# Patient Record
Sex: Female | Born: 1970
Health system: Southern US, Community
[De-identification: ages and names within clinical notes are randomized; demographics above are authoritative.]

## PROBLEM LIST (undated history)

## (undated) DIAGNOSIS — Z5189 Encounter for other specified aftercare: Secondary | ICD-10-CM

## (undated) DIAGNOSIS — E079 Disorder of thyroid, unspecified: Secondary | ICD-10-CM

## (undated) DIAGNOSIS — I10 Essential (primary) hypertension: Secondary | ICD-10-CM

## (undated) DIAGNOSIS — K219 Gastro-esophageal reflux disease without esophagitis: Secondary | ICD-10-CM

## (undated) DIAGNOSIS — E785 Hyperlipidemia, unspecified: Secondary | ICD-10-CM

## (undated) DIAGNOSIS — F419 Anxiety disorder, unspecified: Secondary | ICD-10-CM

## (undated) DIAGNOSIS — K449 Diaphragmatic hernia without obstruction or gangrene: Secondary | ICD-10-CM

## (undated) HISTORY — DX: Anxiety disorder, unspecified: F41.9

## (undated) HISTORY — PX: OTHER SURGICAL HISTORY: SHX169

## (undated) HISTORY — DX: Disorder of thyroid, unspecified: E07.9

## (undated) HISTORY — DX: Gastro-esophageal reflux disease without esophagitis: K21.9

## (undated) HISTORY — DX: Encounter for other specified aftercare: Z51.89

## (undated) HISTORY — DX: Hyperlipidemia, unspecified: E78.5

## (undated) HISTORY — DX: Essential (primary) hypertension: I10

## (undated) HISTORY — DX: Diaphragmatic hernia without obstruction or gangrene: K44.9

---
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
## 1997-09-15 ENCOUNTER — Other Ambulatory Visit: Admission: RE | Admit: 1997-09-15 | Discharge: 1997-09-15 | Payer: Self-pay | Admitting: Obstetrics and Gynecology

## 1998-11-23 ENCOUNTER — Inpatient Hospital Stay (HOSPITAL_COMMUNITY): Admission: AD | Admit: 1998-11-23 | Discharge: 1998-11-26 | Payer: Self-pay | Admitting: Obstetrics and Gynecology

## 1999-01-04 ENCOUNTER — Other Ambulatory Visit: Admission: RE | Admit: 1999-01-04 | Discharge: 1999-01-04 | Payer: Self-pay | Admitting: Obstetrics and Gynecology

## 2000-01-24 ENCOUNTER — Other Ambulatory Visit: Admission: RE | Admit: 2000-01-24 | Discharge: 2000-01-24 | Payer: Self-pay | Admitting: Obstetrics and Gynecology

## 2000-05-16 ENCOUNTER — Ambulatory Visit (HOSPITAL_COMMUNITY): Admission: RE | Admit: 2000-05-16 | Discharge: 2000-05-16 | Payer: Self-pay | Admitting: *Deleted

## 2000-10-30 ENCOUNTER — Other Ambulatory Visit: Admission: RE | Admit: 2000-10-30 | Discharge: 2000-10-30 | Payer: Self-pay | Admitting: Obstetrics and Gynecology

## 2001-05-06 ENCOUNTER — Inpatient Hospital Stay (HOSPITAL_COMMUNITY): Admission: RE | Admit: 2001-05-06 | Discharge: 2001-05-09 | Payer: Self-pay | Admitting: Obstetrics and Gynecology

## 2001-05-06 ENCOUNTER — Encounter (INDEPENDENT_AMBULATORY_CARE_PROVIDER_SITE_OTHER): Payer: Self-pay

## 2001-06-24 ENCOUNTER — Other Ambulatory Visit: Admission: RE | Admit: 2001-06-24 | Discharge: 2001-06-24 | Payer: Self-pay | Admitting: Obstetrics and Gynecology

## 2002-07-10 ENCOUNTER — Other Ambulatory Visit: Admission: RE | Admit: 2002-07-10 | Discharge: 2002-07-10 | Payer: Self-pay | Admitting: Obstetrics and Gynecology

## 2003-07-29 ENCOUNTER — Other Ambulatory Visit: Admission: RE | Admit: 2003-07-29 | Discharge: 2003-07-29 | Payer: Self-pay | Admitting: Obstetrics and Gynecology

## 2004-10-03 ENCOUNTER — Other Ambulatory Visit: Admission: RE | Admit: 2004-10-03 | Discharge: 2004-10-03 | Payer: Self-pay | Admitting: Obstetrics and Gynecology

## 2004-10-27 ENCOUNTER — Encounter (INDEPENDENT_AMBULATORY_CARE_PROVIDER_SITE_OTHER): Payer: Self-pay | Admitting: Specialist

## 2004-10-27 ENCOUNTER — Ambulatory Visit (HOSPITAL_COMMUNITY): Admission: RE | Admit: 2004-10-27 | Discharge: 2004-10-27 | Payer: Self-pay | Admitting: Obstetrics and Gynecology

## 2005-03-13 HISTORY — PX: VAGINAL HYSTERECTOMY: SUR661

## 2005-03-13 HISTORY — PX: ABDOMINAL HYSTERECTOMY: SHX81

## 2005-12-14 ENCOUNTER — Inpatient Hospital Stay (HOSPITAL_COMMUNITY): Admission: AD | Admit: 2005-12-14 | Discharge: 2005-12-14 | Payer: Self-pay | Admitting: Obstetrics and Gynecology

## 2006-02-07 ENCOUNTER — Ambulatory Visit (HOSPITAL_COMMUNITY): Admission: RE | Admit: 2006-02-07 | Discharge: 2006-02-08 | Payer: Self-pay | Admitting: Obstetrics and Gynecology

## 2006-02-07 ENCOUNTER — Encounter (INDEPENDENT_AMBULATORY_CARE_PROVIDER_SITE_OTHER): Payer: Self-pay | Admitting: Specialist

## 2007-10-21 LAB — CONVERTED CEMR LAB: Pap Smear: NORMAL

## 2008-03-13 HISTORY — PX: UPPER GASTROINTESTINAL ENDOSCOPY: SHX188

## 2008-05-01 ENCOUNTER — Ambulatory Visit: Payer: Self-pay | Admitting: Gastroenterology

## 2008-05-01 DIAGNOSIS — R197 Diarrhea, unspecified: Secondary | ICD-10-CM | POA: Insufficient documentation

## 2008-05-01 DIAGNOSIS — K625 Hemorrhage of anus and rectum: Secondary | ICD-10-CM | POA: Insufficient documentation

## 2008-05-01 DIAGNOSIS — R112 Nausea with vomiting, unspecified: Secondary | ICD-10-CM

## 2008-05-01 DIAGNOSIS — K5289 Other specified noninfective gastroenteritis and colitis: Secondary | ICD-10-CM

## 2008-05-01 DIAGNOSIS — R1013 Epigastric pain: Secondary | ICD-10-CM

## 2008-05-01 DIAGNOSIS — K219 Gastro-esophageal reflux disease without esophagitis: Secondary | ICD-10-CM

## 2008-05-01 DIAGNOSIS — E039 Hypothyroidism, unspecified: Secondary | ICD-10-CM | POA: Insufficient documentation

## 2008-05-06 LAB — CONVERTED CEMR LAB
Amylase: 49 units/L (ref 27–131)
Basophils Absolute: 0 10*3/uL (ref 0.0–0.1)
Bilirubin, Direct: 0.1 mg/dL (ref 0.0–0.3)
Eosinophils Absolute: 0.2 10*3/uL (ref 0.0–0.7)
HCT: 47.4 % — ABNORMAL HIGH (ref 36.0–46.0)
Lipase: 20 units/L (ref 11.0–59.0)
MCHC: 34.6 g/dL (ref 30.0–36.0)
MCV: 92.9 fL (ref 78.0–100.0)
Monocytes Absolute: 0.8 10*3/uL (ref 0.1–1.0)
Neutro Abs: 3.2 10*3/uL (ref 1.4–7.7)
Platelets: 216 10*3/uL (ref 150–400)
RDW: 12 % (ref 11.5–14.6)
Total Bilirubin: 0.6 mg/dL (ref 0.3–1.2)

## 2008-05-13 ENCOUNTER — Ambulatory Visit: Payer: Self-pay | Admitting: Internal Medicine

## 2008-05-14 ENCOUNTER — Ambulatory Visit: Payer: Self-pay | Admitting: Internal Medicine

## 2008-05-14 DIAGNOSIS — F411 Generalized anxiety disorder: Secondary | ICD-10-CM | POA: Insufficient documentation

## 2008-06-11 DIAGNOSIS — K449 Diaphragmatic hernia without obstruction or gangrene: Secondary | ICD-10-CM | POA: Insufficient documentation

## 2008-06-16 ENCOUNTER — Ambulatory Visit: Payer: Self-pay | Admitting: Internal Medicine

## 2009-01-11 ENCOUNTER — Encounter: Payer: Self-pay | Admitting: Nurse Practitioner

## 2009-07-01 ENCOUNTER — Encounter: Admission: RE | Admit: 2009-07-01 | Discharge: 2009-07-01 | Payer: Self-pay | Admitting: Obstetrics and Gynecology

## 2009-07-21 ENCOUNTER — Encounter: Payer: Self-pay | Admitting: Internal Medicine

## 2009-11-03 ENCOUNTER — Encounter: Payer: Self-pay | Admitting: Internal Medicine

## 2010-04-12 NOTE — Letter (Signed)
Summary: [facility]   Imported By: Sherian Rein 08/11/2009 14:14:00  _____________________________________________________________________  External Attachment:    Type:   Image     Comment:   External Document

## 2010-04-12 NOTE — Medication Information (Signed)
Summary: Nexium/[facility]  Nexium/[facility]   Imported By: Sherian Rein 11/08/2009 08:09:27  _____________________________________________________________________  External Attachment:    Type:   Image     Comment:   External Document

## 2010-07-29 NOTE — H&P (Signed)
NAME:  Erin Kaufman, Erin Kaufman             ACCOUNT NO.:  [id number]   MEDICAL RECORD NO.:  [id number]          PATIENT TYPE:  AMB   LOCATION:  SDC                           FACILITY:  [facility]   PHYSICIAN:  Juluis Mire, M.D.   DATE OF BIRTH:  August 31, 1970   DATE OF ADMISSION:  10/27/2004  DATE OF DISCHARGE:                                HISTORY & PHYSICAL   HISTORY OF PRESENT ILLNESS:  The patient is a 40 year old gravida 3, para 3,  married female who presents for NovaSure ablation.   In relation to the present admission, cycles are regular at the present  time.  She reports increasing menstrual flow.  At times she will have the  first two days of flooding through her clothes.  She had a previous saline  infusion ultrasound in 2004 that was negative.  There were findings highly  suggestive of adenomyosis.  We had discussed with her various options  including birth control pills versus Marina IUD versus endometrial ablation.  The patient now presents for endometrial ablation.  We will do a preablative  hysteroscopy to evaluate the intra-uterine cavity.   ALLERGIES:  She is allergic to PENICILLIN.   MEDICATIONS:  Synthroid.   PAST MEDICAL HISTORY:  Significant in that she had a thyroid nodule.  She is  under active evaluation by Dr. Gerrit Friends.  She had previous aspiration and is  presently on Synthroid as noted.  Otherwise, usual childhood diseases.   PAST SURGICAL HISTORY:  She has had her wisdom teeth extracted.  She has had  an abdominoplasty.  She has had three cesarean sections.   FAMILY HISTORY:  Noncontributory.   SOCIAL HISTORY:  No tobacco or alcohol use.   REVIEW OF SYSTEMS:  Noncontributory.   PHYSICAL EXAMINATION:  VITAL SIGNS:  The patient is afebrile with stable  vital signs.  HEENT:  The pupils are equal, round and reactive to light and accommodation.  Extraocular movements were intact.  Sclerae and conjunctivae are clear.  Oropharynx clear.  No palpable thyromegaly.  BREASTS:  No discrete masses.  LUNGS:  Clear.  CARDIOVASCULAR:  Regular rhythm and rate without murmurs or gallops.  ABDOMEN:  Previous abdominoplasty noted.  Otherwise no masses, organomegaly  or tenderness.  PELVIC:  Normal external genitalia.  Vaginal mucosa is clear.  Cervix is  unremarkable.  Uterus is upper limits of normal size.  Adnexa unremarkable.  EXTREMITIES:  Trace edema.  NEUROLOGIC:  Exam is grossly within normal limits.   IMPRESSION:  Menorrhagia probably secondary to adenomyosis.   PLAN:  The patient will undergo hysteroscopy along with NovaSure ablation.  Success rates of 80% are quoted.  Risks of surgery explained including the  risks of infection, risks of hemorrhage that could require transfusion with  the risks of AIDS or hepatitis and the possible need for hysterectomy, risks  of injury to adjacent organs or perforation that could lead to exploratory  surgery, risks of deep vein thrombosis and pulmonary embolus.  The patient  expressed understanding of indications and risks and other alternatives.      Juluis Mire, M.D.  Electronically Signed  JSM/MEDQ  D:  10/27/2004  T:  10/27/2004  Job:  [id number]

## 2010-07-29 NOTE — H&P (Signed)
[facility]  Patient:    Erin Kaufman, Erin Kaufman Visit Number: [id number] MRN: [id number]          Service Type: Attending:  Juluis Mire, M.D. Dictated by:   Juluis Mire, M.D. Adm. Date:  05/06/01                           History and Physical  HISTORY OF PRESENT ILLNESS:   The patient is a 40 year old gravida 3 para 2 married 2 married white female, last menstrual period August 15, 2000, giving estimated date of confinement of May 22, 2001.  This gives her an estimated gestational age of 38+ weeks.  However, by two prior ultrasounds - one done at 18 and at 25 weeks, she has an estimated date of confinement of May 11, 2001. This gives her an estimated gestational age of [redacted] weeks.                                The patients two prior pregnancies were delivered by cesarean section.  After discussion of trial of labor this patient decided to proceed with repeat cesarean section.  She is also desirous of permanent sterilization at this time.  Alternatives of birth control have been discussed.  She does understand there is a failure rate with sterilization of 1:200.  Failures can in the form of ectopic pregnancy requiring further surgical management.  Her prenatal course has been complicated by positive group B strep screen with prior pregnancy.  Otherwise, her prenatal course has been uncomplicated.  ALLERGIES:                    No known drug allergies.  MEDICATIONS:                  Prenatal vitamins.  PRENATAL LABORATORY DATA:     The patient is O-positive.  Negative antibody screen.  Nonreactive serology.  Positive rubella titer.  Negative hepatitis B surface antigen.  Maternal serum triple screen was negative.  A 50 g glucola was 93.  PAST MEDICAL HISTORY/FAMILY HISTORY/SOCIAL:               Please see prenatal records.  REVIEW OF SYSTEMS:            Noncontributory.  PHYSICAL EXAMINATION:  VITAL SIGNS:                  The patient is  afebrile with stable vital signs.  HEENT:                        Normocephalic.  PERRLA.  EOMI.  Sclerae and conjunctivae clear.  Oropharynx clear.  NECK:                         Without thyromegaly.  BREAST:                       No discrete masses but glandular.  LUNGS:                        Clear.  CARDIAC:                      Regular rate and rhythm with grade 2/6 systolic ejection murmur.  No clicks  or gallops.  ABDOMEN:                      Gravid uterus consistent with dates.  No masses, organomegaly, or tenderness.  PELVIC:                       Cervix long and closed.  EXTREMITIES:                  Trace edema.  NEUROLOGIC:                   Grossly within normal limits.  IMPRESSION:                   1. Intrauterine pregnancy at 39 weeks.                               2. Prior cesarean section, desires repeat.                               3. Multiparity, desires sterility.                               4. Positive group B streptococci screen w                                  prior pregnancy.  PLAN:                         We will proceed with repeat cesarean section and bilateral tubal ligation.  The risks of surgery have been discussed including the risk of infection, the risk of hemorrhage that could require transfusion with the risk of AIDS or hepatitis, the risk of injury to adjacent organs including bladder, bowel, or ureters that could require further exploratory surgery, the risk of deep vein thrombosis and pulmonary embolus.  The patient expressed understanding of the indications and risks, and is accepting of them. Dictated by:   Juluis Mire, M.D. Attending:  Juluis Mire, M.D. DD:  05/06/01 TD:  05/06/01 Job: [id number]

## 2010-07-29 NOTE — H&P (Signed)
NAME:  Erin Kaufman, Erin Kaufman             ACCOUNT NO.:  [id number]   MEDICAL RECORD NO.:  [id number]          PATIENT TYPE:  AMB   LOCATION:  SDC                           FACILITY:  [facility]   PHYSICIAN:  Juluis Mire, M.D.   DATE OF BIRTH:  12/07/1970   DATE OF ADMISSION:  02/07/2006  DATE OF DISCHARGE:                              HISTORY & PHYSICAL   HISTORY OF PRESENT ILLNESS:  The patient is a 40 year old gravida 3,  para 3 married female who presents for laparoscopically assisted vaginal  hysterectomy.   The patient had a previous NovaSure ablation. She has been amenorrheic.  However, she has recurrent episodes of severe left lower quadrant pain  that occur on a monthly basis.  We have done ultrasound evaluation  including saline infusion.  She really has no endometrium per se, but  seems to have pockets of blood which we can't tell whether it is  hematometrium or whether this is just significant adenomyosis leading to  the intermittent pain.  Other work up has been completely negative.  There is no evidence of a GI or urological cause.  In view of these  persistent episodes of pain, thought to be secondary to adenomyosis  versus hematometrium, the patient now presents for laparoscopically  assisted vaginal hysterectomy.   ALLERGIES:  In terms of allergies, she is ALLERGIC to PENICILLIN.   MEDICATIONS:  Synthroid.   PAST MEDICAL HISTORY:  She did have a thyroid nodule that is being  evaluated by Dr. Gerrit Friends.  She had a previous aspiration and is presently  on Synthroid replacement.   PAST SURGICAL HISTORY:  She had her wisdom teeth extracted.  She has had  an abdominoplasty and three Cesarean sections.   FAMILY HISTORY:  Noncontributory.   SOCIAL HISTORY:  No tobacco or alcohol use.   REVIEW OF SYSTEMS:  Noncontributory.   PHYSICAL EXAMINATION:  VITAL SIGNS:  Patient is afebrile with stable  vital signs.  HEENT:  Patient is normocephalic. Pupils equal, round, reactive to  light  and accommodation.  Extraocular movements intact.  Sclerae and  conjunctivae are clear.  Oropharynx is clear.  NECK:  Without thyromegaly.  BREASTS:  No discrete masses.  LUNGS:  Clear.  CARDIAC SYSTEM:  Regular rate and rhythm without murmurs, rubs or  gallops.  ABDOMEN:  Her abdominal exam is benign, no masses, organomegaly or  tenderness.  PELVIS:  Normal external genitalia, vaginal mucosa is clear.  Cervix is  unremarkable.  Uterus normal size, shape and contour.  Adnexa are free  of masses or tenderness.  EXTREMITIES:  Trace edema.  NEUROLOGICAL:  Exam is grossly within normal limits.   IMPRESSION:  Intermittent pelvic pain thought to be secondary to  adenomyosis and/or hematometrium.   PLAN:  The patient is to undergo laparoscopically assisted vaginal  hysterectomy.  The risks of surgery have been discussed including the  risk of infection, the risk of hemorrhage that could require transfusion  with risk of AIDS or hepatitis, risk of injury to adjacent organs  including bowel, bladder, ureters that could require further exploratory  surgery, risk of deep venous  thrombosis and pulmonary embolus.  The  patient expressed understanding of indications and risks.      Juluis Mire, M.D.  Electronically Signed     JSM/MEDQ  D:  02/07/2006  T:  02/07/2006  Job:  [id number]

## 2010-07-29 NOTE — Op Note (Signed)
NAME:  Erin Kaufman, Erin Kaufman             ACCOUNT NO.:  [id number]   MEDICAL RECORD NO.:  [id number]          PATIENT TYPE:  AMB   LOCATION:  SDC                           [facility]ACILITY:  [facility]   PHYSICIAN:  Juluis Mire, M.D.   DATE OF BIRTH:  [birth year]   DATE OF PROCEDURE:  02/07/2006  DATE OF DISCHARGE:                               OPERATIVE REPORT   PREOPERATIVE DIAGNOSIS:  Adenomyosis.   POSTOPERATIVE DIAGNOSIS:  Adenomyosis with pelvic adhesions.   PROCEDURE:  Laparoscopically assisted vaginal hysterectomy along with  cystoscopy.   SURGEON:  Juluis Mire, M.D.   ASSISTANT:  Zelphia Cairo, MD   ANESTHESIA:  General endotracheal.   ESTIMATED BLOOD LOSS:  400 mL.   PACKS AND DRAINS:  None.   INTRAOPERATIVE BLOOD REPLACED:  None.   COMPLICATIONS:  None.   INDICATIONS:  Are as noted in the history and physical.   PROCEDURE:  The patient taken to OR, placed supine position.  After  satisfactory level of general endotracheal anesthesia was obtained, the  patient was placed in the dorsal lithotomy position using the Allen  stirrups.  The abdomen, perineum, vagina prepped out with Betadine.  The  bladder was emptied by in-and-out catheterization.  A Hulka tenaculum  put in place and secured.  Patient then draped in sterile field.  The  patient has had a previous abdominoplasty.  We did make a subumbilical  incision with a knife.  Veress needle was inserted.  The abdomen was  inflated with approximately 3.5 liter carbon dioxide.  Operating  laparoscope was introduced.  Laparoscope was through introduced.  There  was no injury to adjacent organs.  It was noted because of her  abdominoplasty, the umbilicus was very close to the uterus.  The uterus  was densely adherent to the anterior abdominal wall probably from her  prior cesarean section.  She has had evidence of previous bilateral  tubal ligation.  The left ovary was adherent the uterus.  The right  ovary was unremarkable.   Appendix was visualized and noted to be normal.  The upper abdomen including the liver tip and gallbladder were clear.  There is no evidence of other pelvic pathology.  Due to the adherence of  the anterior abdominal wall, we put in our 5-mm trocar in the left and  right lower quadrant trying to avoid the bladder.  At this point, we  went to the right side using the gyrus, we cauterized and incised the  right utero-ovarian pedicle.  We then cauterized and incised the right  round ligament freeing up the right side.  We then went to the left side  in order to get to the ovary, we had to free some of the adhesions to  the anterior part of the uterus to anterior abdominal wall.  These were  taken down using cautery and incision with the gyrus.  After we had  freed this we were able to identify the ovary a little better.  We  identified the utero-ovarian pedicle.  We cauterized incised that  freeing the ovary from its attachment to the uterus.  We then identified  the round ligament and cauterized and incised that.  Using sharp  dissection, we further developed the anterior part of the uterus and  freed up from the adhesions to anterior abdominal wall and hopefully  from the bladder connections.  We did not see any evidence of any  bladder injury at this point time and the uterus was adequately freed.  There was no active bleeding.  Decision was to go vaginally.  The  laparoscope was removed.  Abdomen was deflated of carbon dioxide, legs  repositioned, Hulka tenaculum was then removed.  Weighted speculum  placed in vaginal vault.  Cervix was grasped with Christella Hartigan tenaculum, cul-  de-sac was entered sharply.  Both uterosacral ligaments were clamped,  cut, suture ligated with 0 Vicryl.  Reflection of vaginal mucosa  anteriorly was incised.  The bladder dissected superiorly.  Using the  gyrus, the paracervical tissue was cauterized, incised.  Next  parametrial tissue was cauterized incised.  We  tried to identify the  vesicouterine space.  It was difficult due to scarring.  We continued  separating the parametrium from the sides using cautery incision.  We  then flipped the uterus, identified the anterior peritoneum and entered  it sharply, remaining pedicles were clamped and cut, uterus and cervix  passed off the operative field and sent for pathology.  Remaining  pedicles secured with free ties of 0 Vicryl.  Some bleeding was noted,  well controlled with figure-of-eights of 0 Vicryl.  The vaginal mucosa  was then reapproximated in a vertical fashion with figure-of-eights of 0  Vicryl.   Decision was to do cystoscopy due the adhesions to the bladder just to  make sure no entry was noted.  She was given indigo carmine.  The  cystoscope was introduced.  The bladder was distended with irrigation.  Visualization revealed no evidence of any injury to the bladder.  Both  ureteral orifices were noted be spilling blue tinged urine.  The  cystoscope was then removed.  Foley was placed back to straight drain.  The patient's legs were repositioned.   Laparoscope was reintroduced, and was reinflated with carbon dioxide.  We thoroughly irrigated the pelvis, some areas of oozing were noted  vaginal cuff, brought under control with the gyrus.  Both ovaries were  hemostatically intact.  This point in time we redistended the bladder  with irrigation and noted its position in the pelvis, making sure again  no leakage was noted and again there was no signs of any injury to the  bladder.  We again irrigated the pelvis.  We had good hemostasis.  Abdomen was deflated of its carbon dioxide.  All trocars removed.  We  did put a figure-of-eights into the subumbilical fascia of 0 Vicryl.  Skin was closed interrupted subcuticulars of 4-0 Vicryl.  The lower  incisions closed with Dermabond.  A Foley was draining clear blue tinged urine.  Patient taken out of dorsal lithotomy position.  Once alert,   extubated transferred to recovery good condition.  Sponge, instrument,  needle count was reported correct by circulating nurse.      Juluis Mire, M.D.  Electronically Signed     JSM/MEDQ  D:  02/07/2006  T:  02/07/2006  Job:  [id number]

## 2010-07-29 NOTE — Discharge Summary (Signed)
NAME:  Barritt, Noel             ACCOUNT NO.:  [id number]   MEDICAL RECORD NO.:  [id number]          PATIENT TYPE:  OIB   LOCATION:  9307                          FACILITY:  [facility]   PHYSICIAN:  Juluis Mire, M.D.   DATE OF BIRTH:  [birth year]   DATE OF ADMISSION:  02/07/2006  DATE OF DISCHARGE:  02/08/2006                               DISCHARGE SUMMARY   ADMITTING DIAGNOSIS:  Uterine adenomyosis.   POSTOPERATIVE DIAGNOSES:  1. Uterine adenomyosis.  2. Pelvic adhesions.   OPERATIVE PROCEDURES:  Laparoscopic-assisted vaginal hysterectomy,  cystoscopy.   For complete History and Physical, please see dictated note.   COURSE IN THE HOSPITAL:  The patient underwent above noted surgery.  Because of extensive pelvic adhesions, cystoscopy was performed to rule  out bladder injuries. There was no bladder injury noted. Postop did  excellent. Discharged home first postop day. At that time was afebrile,  stable vital signs. Abdomen was soft; bowel sounds were active. She was  voiding without difficulty. No active vaginal bleeding. All incisions  were intact. Hemoglobin 11.1.   In terms of complications, none were encountered during the stay in the  hospital. The patient discharged in stable condition.   DISPOSITION:  The patient is to avoid heavy lifting, vaginal entry,  driving a car. Discharged home on Tylox if needs for pain. She is to  watch for signs of infection, nausea, vomiting, active vaginal bleeding,  increasing abdominal or pelvic pain. Also watch for deep seated venous  thrombosis or signs of pulmonary embolus. Follow up in the office in one  week.      Juluis Mire, M.D.  Electronically Signed     JSM/MEDQ  D:  02/08/2006  T:  02/08/2006  Job:  [id number]

## 2010-07-29 NOTE — Op Note (Signed)
NAME:  Erin Kaufman, Erin Kaufman             ACCOUNT NO.:  [id number]   MEDICAL RECORD NO.:  [id number]          PATIENT TYPE:  AMB   LOCATION:  SDC                           FACILITY:  [facility]   PHYSICIAN:  Juluis Mire, M.D.   DATE OF BIRTH:  Apr 11, 1970   DATE OF PROCEDURE:  10/27/2004  DATE OF DISCHARGE:                                 OPERATIVE REPORT   PREOPERATIVE DIAGNOSIS:  Menorrhagia.   POSTOPERATIVE DIAGNOSIS:  Menorrhagia.   PROCEDURE:  Hysteroscopy, endometrial curettings.  NovaSure ablation.   SURGEON:  Juluis Mire, M.D.   ANESTHESIA:  Sedation with paracervical block.   ESTIMATED BLOOD LOSS:  Minimal.   PACKS AND DRAINS:  None.   BLOOD REPLACED:  None.   COMPLICATIONS:  None.   INDICATIONS FOR PROCEDURE:  Dictated in history and physical.   DESCRIPTION OF PROCEDURE:  The patient was taken to the OR and placed in the  supine position.  After satisfactory level of sedation, the patient was  placed in the dorsal lithotomy position using the Allen stirrups.  The  patient was then draped as a sterile field.  A speculum was placed in the  vaginal vault.  The cervix and vagina were cleansed with Betadine.  A  paracervical block was instituted using 1% Nesacaine.  Cervix was secured  with a single tooth tenaculum.  Uterus sounded to 10 cm.  Endocervical  length was 5 cm.  The cervix was serially dilated to a size 27 Pratt  dilator.  The nonoperative hysteroscope was introduced into the intrauterine  cavity and was distended using Ringer's lactate.  Endometrial evaluation was  unremarkable.  We then obtained endometrial curettings.  NovaSure was then  introduced.  It was properly expanded.  Total cavity width was 4 cm.  We did  pass the CO2 test.  The ablation was then undertaken at a power of 110 for 1  minute 26 seconds.  The NovaSure was then removed intact.  Reevaluation with  the hysteroscope revealed adequately ablated endometrium.  There were no  signs of  perforation  or other complications.  The hysteroscope, single tooth tenaculum, and  speculum were then removed.  The patient was taken out of the dorsal  lithotomy.  Once alert, transferred to the recovery room in good condition.  Needle, sponge, and instrument counts correct.      Juluis Mire, M.D.  Electronically Signed     JSM/MEDQ  D:  10/27/2004  T:  10/27/2004  Job:  [id number]

## 2010-07-29 NOTE — Discharge Summary (Signed)
[facility]  Patient:    Erin Kaufman, Erin Kaufman Visit Number: [id number] MRN: [id number]          Service Type: OBS Location: 910A [id number] [id number] Attending Physician:  Frederich Balding Dictated by:   Danie Chandler, R.N. Admit Date:  05/06/2001 Discharge Date: 05/09/2001                             Discharge Summary  ADMITTING DIAGNOSES:          1. Intrauterine pregnancy at term with prior                                  cesarean section, desires repeat.                               2. Multiparity, desires sterility.  DISCHARGE DIAGNOSES:          1. Intrauterine pregnancy at term with prior                                  cesarean section, desires repeat.                               2. Multiparity, desires sterility.  PROCEDURE:                    On May 06, 2001 repeat low transverse cesarean section and bilateral tubal ligation.  REASON FOR ADMISSION:         Please see dictated H&P.  HOSPITAL COURSE:              The patient was taken to the operating room and underwent the above named procedure without complications.  This was productive of a viable female infant with Apgars of 8 at one minute and 9 at five minutes and an arterial cord pH of 7.29.  Postoperatively on day #1 the patient was without complaints.  Her hemoglobin was 10.0, hematocrit 29.0, and white blood cell count 9.1.  On postoperative day #2 vital signs were stable and afebrile.  On postoperative day #3 she was ready for discharge.  She was tolerating a regular diet and had good return of bowel function.  She was also ambulating well without difficulty and had good pain control.  CONDITION ON DISCHARGE:       Good.  DIET:                         Regular, as tolerated.  FOLLOWUP:                     She is to follow up in the office in one to two weeks for incision check.  She is to call for temperature greater than 100 degrees, persistent nausea and vomiting, heavy  vaginal bleeding, and/or redness or drainage from the incision site.  DISCHARGE MEDICATIONS:        1. Prenatal vitamins one p.o. q.d.                               2. Tylox one to  two p.o. q.4h. p.r.n. Dictated by:   Danie Chandler, R.N. Attending Physician:  Frederich Balding DD:  05/22/01 TD:  05/24/01 Job: [id number]

## 2010-07-29 NOTE — Op Note (Signed)
[facility]  Patient:    Erin Kaufman, Erin Kaufman Visit Number: [id number] MRN: [id number]          Service Type: OBS Location: 910A [id number] [id number] Attending Physician:  Frederich Balding Dictated by:   Juluis Mire, M.D. Proc. Date: 05/06/01 Admit Date:  05/06/2001                             Operative Report  PREOPERATIVE DIAGNOSES:       Intrauterine pregnancy at term with prior cesarean section, desires repeat, multiparity, desires sterility.  POSTOPERATIVE DIAGNOSES:      Intrauterine pregnancy at term with prior cesarean section, desires repeat, multiparity, desires sterility.  OPERATIVE PROCEDURE:          Low transverse cesarean section, bilateral tubal ligation.  SURGEON:                      Juluis Mire, M.D.  ANESTHESIA:                   Spinal.  ESTIMATED BLOOD LOSS:         800 cc.  PACKS AND DRAINS:             None.  INTRAOPERATIVE BLOOD:         None.  COMPLICATIONS:                None.  INDICATIONS:                  Noted in the history and physical.  PROCEDURE:                    Patient taken to the OR.  Placed in supine position with left lateral tilt.  After a satisfactory level of spinal anesthesia was obtained, the abdomen was prepped out with Betadine and draped in a sterile field.  Prior low transverse skin incision was identified and excised.  The incision was extended through subcutaneous tissue.  The fascia was entered sharply, incision to fascia extended laterally.  Fascia taken off the muscles superiorly and inferiorly.  Rectus muscles were separated in the midline.  Peritoneum was entered sharply.  Incision of peritoneum extended both superiorly and inferiorly.  Low transverse bladder flap was developed. Low transverse uterine incision was begun with the knife and extended laterally using manual retraction.  The infant presented in the vertex presentation with delivery with elevation of head and fundal  pressure.  There was a nuchal cord x1.  Amniotic fluid was clear.  Infant was a viable female who weighed 8 pounds.  Apgars were 8 and 9.  Umbilical cord pH was 7.29.  Placenta was then delivered manually.  Uterus wiped free of any remaining membranes and placenta.  Uterus was then closed in a running locking suture with 0 Vicryl in two layer closure technique.  The uterus was then exteriorized.  The patient, again, desired permanent sterilization.  Both tubes were identified.  A hole was made in the avascular area of the mesosalpinx.  Individual ligatures of 0 plain catgut were used to ligate off a segment of tube.  The intervening segment of tube was then excised.  The cut end of the tube was cauterized with the Bovie.  Hemostasis was excellent.  Ovaries were unremarkable.  Uterus was returned to the abdominal cavity.  We thoroughly irrigated the pelvis. Hemostasis was excellent.  At  this point in time the muscle was closed with a running suture of 3-0 Vicryl.  Fascia closed with running suture of 0 PDS. Skin was closed with staples and Steri-Strips.  Sponge, instrument, and needle count reported as correct by circulated nurse x2.  Foley catheter was clear at the time of closure.  Patient did tolerate procedure well and was returned to recovery room in excellent condition. Dictated by:   Juluis Mire, M.D. Attending Physician:  Frederich Balding DD:  05/06/01 TD:  05/06/01 Job: [id number]

## 2010-08-29 ENCOUNTER — Encounter (INDEPENDENT_AMBULATORY_CARE_PROVIDER_SITE_OTHER): Payer: Self-pay | Admitting: Surgery

## 2010-10-13 ENCOUNTER — Other Ambulatory Visit: Payer: Self-pay | Admitting: Internal Medicine

## 2011-02-13 ENCOUNTER — Telehealth: Payer: Self-pay

## 2011-02-13 NOTE — Telephone Encounter (Signed)
Faxed prior authorization form to [facility]

## 2011-02-14 ENCOUNTER — Telehealth: Payer: Self-pay

## 2011-02-14 NOTE — Telephone Encounter (Signed)
Spoke with pharmacist and gave her the go ahead to refill nexium - received prior authorization

## 2011-02-14 NOTE — Telephone Encounter (Signed)
Told patient her nexium had been approved and was at pharmacy;

## 2011-02-14 NOTE — Telephone Encounter (Signed)
Responded to [employer] letting them know patient has tried several ppi's in the past and Nexium has worked the best

## 2011-08-12 ENCOUNTER — Other Ambulatory Visit (INDEPENDENT_AMBULATORY_CARE_PROVIDER_SITE_OTHER): Payer: Self-pay | Admitting: Surgery

## 2011-08-15 ENCOUNTER — Telehealth (INDEPENDENT_AMBULATORY_CARE_PROVIDER_SITE_OTHER): Payer: Self-pay

## 2011-08-15 NOTE — Telephone Encounter (Signed)
Received request for refill synthroid. LMOM for pt to call re: rx and f/u. Pt is due for U/S and TSH. Need these to determine if current dosage on synthroid is correct.

## 2011-08-21 ENCOUNTER — Other Ambulatory Visit (INDEPENDENT_AMBULATORY_CARE_PROVIDER_SITE_OTHER): Payer: Self-pay

## 2011-08-21 DIAGNOSIS — E041 Nontoxic single thyroid nodule: Secondary | ICD-10-CM

## 2011-08-24 ENCOUNTER — Ambulatory Visit
Admission: RE | Admit: 2011-08-24 | Discharge: 2011-08-24 | Disposition: A | Discharge status: Home / Self Care | Payer: 59 | Source: Ambulatory Visit | Attending: Surgery | Admitting: Surgery

## 2011-08-24 ENCOUNTER — Other Ambulatory Visit (INDEPENDENT_AMBULATORY_CARE_PROVIDER_SITE_OTHER): Payer: Self-pay | Admitting: Surgery

## 2011-08-24 DIAGNOSIS — E041 Nontoxic single thyroid nodule: Secondary | ICD-10-CM

## 2011-08-25 LAB — TSH: TSH: 1.47 u[IU]/mL (ref 0.350–4.500)

## 2011-09-01 ENCOUNTER — Telehealth (INDEPENDENT_AMBULATORY_CARE_PROVIDER_SITE_OTHER): Payer: Self-pay

## 2011-09-01 NOTE — Telephone Encounter (Signed)
U/S and labs to [facility] to review, awaiting his recommendation.

## 2011-09-11 ENCOUNTER — Telehealth (INDEPENDENT_AMBULATORY_CARE_PROVIDER_SITE_OTHER): Payer: Self-pay

## 2011-09-11 NOTE — Telephone Encounter (Signed)
Pt notified per Dr Ardine Eng request that labs are fine and U/S ok. Pt to keep 8-5 appt to discuss f/u.

## 2011-10-04 ENCOUNTER — Other Ambulatory Visit: Payer: Self-pay | Admitting: Internal Medicine

## 2011-10-16 ENCOUNTER — Encounter (INDEPENDENT_AMBULATORY_CARE_PROVIDER_SITE_OTHER): Payer: Self-pay | Admitting: Surgery

## 2011-10-16 ENCOUNTER — Ambulatory Visit (INDEPENDENT_AMBULATORY_CARE_PROVIDER_SITE_OTHER): Payer: 59 | Admitting: Surgery

## 2011-10-16 VITALS — BP 112/70 | HR 66 | Temp 97.3°F | Resp 14 | Ht 61.0 in | Wt 175.1 lb

## 2011-10-16 DIAGNOSIS — E042 Nontoxic multinodular goiter: Secondary | ICD-10-CM | POA: Insufficient documentation

## 2011-10-16 MED ORDER — SYNTHROID 125 MCG PO TABS: 125.0000 ug | ORAL_TABLET | Freq: Every day | ORAL | Status: DC

## 2011-10-16 NOTE — Patient Instructions (Signed)
Repeat ultrasound and TSH levels in one year.  Velora Heckler, MD, [facility] Office: [phone]

## 2011-10-16 NOTE — Progress Notes (Signed)
General Surgery [facility]  Visit Diagnoses: 1. Multinodular goiter (nontoxic)     HISTORY: Patient is a 41 year old white female followed for several years in our practice for multinodular thyroid goiter and hypothyroidism. At my request she underwent a thyroid ultrasound on 08/24/2011. This demonstrated a slightly enlarged thyroid gland. There is a complex septated cyst in the lower pole of the left lobe measuring 1.1 cm. This is essentially unchanged over the years. There is also a nodule in the right lobe measuring 7 mm in size which is hypoechoic. This has been present on prior ultrasounds performed here in the office and is essentially unchanged.  Patient had a TSH level performed and it is normal at 1.47 on her current dose of Synthroid 125 mcg daily.  PERTINENT REVIEW OF SYSTEMS: Patient has no new complaints. She denies tremors. She denies palpitations. Weight is stable. Energy level is good.  EXAM: HEENT: normocephalic; pupils equal and reactive; sclerae clear; dentition good; mucous membranes moist NECK:  symmetric on extension; no palpable anterior or posterior cervical lymphadenopathy; no supraclavicular masses; no tenderness CHEST: clear to auscultation bilaterally without rales, rhonchi, or wheezes CARDIAC: regular rate and rhythm without significant murmur; peripheral pulses are full EXT:  non-tender without edema; no deformity NEURO: no gross focal deficits; no sign of tremor   IMPRESSION: Small multinodular thyroid goiter, clinically stable Hypothyroidism  PLAN: Patient and I discussed the above findings at length. I have renewed her prescription for Synthroid 125 mcg daily. We will see her in followup in one year. We will repeat her thyroid ultrasound and her TSH level at that time.  Velora Heckler, MD, [facility] Office: [phone]

## 2012-01-22 ENCOUNTER — Other Ambulatory Visit: Payer: Self-pay | Admitting: Internal Medicine

## 2012-01-23 ENCOUNTER — Telehealth: Payer: Self-pay | Admitting: Internal Medicine

## 2012-01-23 MED ORDER — ESOMEPRAZOLE MAGNESIUM 40 MG PO CPDR: 40.0000 mg | DELAYED_RELEASE_CAPSULE | Freq: Every day | ORAL | Status: DC

## 2012-01-23 NOTE — Telephone Encounter (Signed)
Refilled Nexium 

## 2012-02-20 ENCOUNTER — Telehealth: Payer: Self-pay | Admitting: Internal Medicine

## 2012-02-20 MED ORDER — ESOMEPRAZOLE MAGNESIUM 40 MG PO CPDR: 40.0000 mg | DELAYED_RELEASE_CAPSULE | Freq: Every day | ORAL | Status: DC

## 2012-02-20 NOTE — Telephone Encounter (Signed)
Left message with patient letting her know I had refilled Nexium

## 2012-02-21 ENCOUNTER — Ambulatory Visit: Payer: 59 | Admitting: Internal Medicine

## 2012-02-22 LAB — HM PAP SMEAR: HM Pap smear: NORMAL

## 2012-02-29 ENCOUNTER — Telehealth: Payer: Self-pay

## 2012-02-29 NOTE — Telephone Encounter (Signed)
Erroneous encounter

## 2012-03-04 ENCOUNTER — Telehealth: Payer: Self-pay

## 2012-03-04 NOTE — Telephone Encounter (Signed)
Spoke with pharmacy notifying them that Nexium 40mg  capsules have prior auth. Thru 03/04/13.  This was done thru [facility] rx # [phone] with the help of representative Nav.  Pt's ID# is [id number].  Pt has appointment early Jan. With Dr. Marina Goodell.

## 2012-03-18 ENCOUNTER — Ambulatory Visit: Payer: 59 | Admitting: Internal Medicine

## 2012-03-20 ENCOUNTER — Encounter: Payer: Self-pay | Admitting: Internal Medicine

## 2012-03-20 ENCOUNTER — Ambulatory Visit (INDEPENDENT_AMBULATORY_CARE_PROVIDER_SITE_OTHER): Payer: 59 | Admitting: Internal Medicine

## 2012-03-20 VITALS — BP 126/90 | HR 104 | Ht 61.0 in | Wt 180.6 lb

## 2012-03-20 DIAGNOSIS — J069 Acute upper respiratory infection, unspecified: Secondary | ICD-10-CM

## 2012-03-20 DIAGNOSIS — K219 Gastro-esophageal reflux disease without esophagitis: Secondary | ICD-10-CM

## 2012-03-20 MED ORDER — ESOMEPRAZOLE MAGNESIUM 40 MG PO CPDR: 40.0000 mg | DELAYED_RELEASE_CAPSULE | Freq: Every day | ORAL | Status: DC

## 2012-03-20 MED ORDER — AZITHROMYCIN 250 MG PO TABS: ORAL_TABLET | ORAL | Status: DC

## 2012-03-20 NOTE — Progress Notes (Signed)
HISTORY OF PRESENT ILLNESS:  Erin Kaufman is a 42 y.o. female , daughter of Erin Kaufman, who presents today for followup regarding GERD. She was last seen in April of 2010. Her initial evaluation was for epigastric pain felt to be reflux. Upper endoscopy performed 05/13/2008 was normal except for a hiatal hernia. She responded to Nexium. Was advised with regards to reflux precautions. She has been on Nexium 40 mg daily. Recently ran out of the medication with significant breakthrough. No dysphagia. No other GI complaints. Non-GI complaints include the development of significant productive cough and aches. Questionable fever. She continues to smoke.  REVIEW OF SYSTEMS:  All non-GI ROS negative except for upper respiratory illness  Past Medical History  Diagnosis Date  . Hiatal hernia   . Thyroid disease   . GERD (gastroesophageal reflux disease)   . Anxiety     Past Surgical History  Procedure Date  . Abdominal hysterectomy 2007    Social History Erin Kaufman  reports that she has been smoking.  She has never used smokeless tobacco. She reports that she drinks alcohol. She reports that she does not use illicit drugs.  family history includes Brain cancer in her mother; Diabetes in her mother; Heart disease in her mother; Lymphoma in her mother; and Prostate cancer in her father.  There is no history of Colon cancer.  Allergies  Allergen Reactions  . Penicillins Rash    All over the body       PHYSICAL EXAMINATION: Vital signs: BP 126/90  Pulse 104  Ht 5\' 1"  (1.549 m)  Wt 180 lb 9.6 oz (81.92 kg)  BMI 34.12 kg/m2 General: Well-developed, well-nourished, no acute distress HEENT: Sclerae are anicteric, conjunctiva injected with watery eyes. Oral mucosa intact Lungs: Clear with end expiratory wheezing on the left Heart: Regular Abdomen: soft, nontender, nondistended, no obvious ascites, no peritoneal signs, normal bowel sounds. No organomegaly. Extremities: No  edema Psychiatric: alert and oriented x3. Cooperative    ASSESSMENT:  #1. GERD. Symptoms requiring PPI for control. Negative index endoscopy March 2010. Requests medication refill #2. Acute upper respiratory illness, likely bronchitis, with productive cough, questionable fever, and mild wheezing. Smoker.   PLAN:  #1. Refill Nexium #2. Reflux precautions. Stop smoking #3. Prescribed Z-Pak for acute URI. #4. Tylenol Cold and flu as needed #5. Plenty of fluids #6. Routine office followup in 2 years. Sooner if needed

## 2012-03-20 NOTE — Patient Instructions (Addendum)
You have been given a separate informational sheet regarding your tobacco use, the importance of quitting and local resources to help you quit.   We have sent the following medications to your pharmacy for you to pick up at your convenience:  Nexium, Zithromax (Z-pack)  You may take Tylenol cold and flu over the counter; make sure to get plenty of fluids  Follow up with Dr. Marina Goodell in 2 years

## 2012-04-24 ENCOUNTER — Ambulatory Visit: Payer: 59 | Admitting: Family Medicine

## 2012-06-14 ENCOUNTER — Encounter: Payer: Self-pay | Admitting: Family Medicine

## 2012-06-14 ENCOUNTER — Ambulatory Visit (INDEPENDENT_AMBULATORY_CARE_PROVIDER_SITE_OTHER): Payer: 59 | Admitting: Family Medicine

## 2012-06-14 VITALS — BP 128/98 | HR 92 | Temp 98.5°F | Ht 62.25 in | Wt 181.8 lb

## 2012-06-14 DIAGNOSIS — Z1331 Encounter for screening for depression: Secondary | ICD-10-CM

## 2012-06-14 DIAGNOSIS — E039 Hypothyroidism, unspecified: Secondary | ICD-10-CM

## 2012-06-14 DIAGNOSIS — R030 Elevated blood-pressure reading, without diagnosis of hypertension: Secondary | ICD-10-CM

## 2012-06-14 DIAGNOSIS — K219 Gastro-esophageal reflux disease without esophagitis: Secondary | ICD-10-CM

## 2012-06-14 LAB — HEPATIC FUNCTION PANEL
Albumin: 3.9 g/dL (ref 3.5–5.2)
Alkaline Phosphatase: 79 U/L (ref 39–117)
Total Protein: 7.6 g/dL (ref 6.0–8.3)

## 2012-06-14 LAB — BASIC METABOLIC PANEL
BUN: 9 mg/dL (ref 6–23)
CO2: 26 mEq/L (ref 19–32)
Calcium: 9.1 mg/dL (ref 8.4–10.5)
Creatinine, Ser: 0.6 mg/dL (ref 0.4–1.2)
Glucose, Bld: 92 mg/dL (ref 70–99)
Sodium: 136 mEq/L (ref 135–145)

## 2012-06-14 NOTE — Assessment & Plan Note (Signed)
New.  Pt has had elevated BP in GYN office and again here today but mother just passed away last week.  Suspect she does have HTN but will confirm this at f/u visit in 3-4 weeks.  If BP still elevated, will start HCTZ and follow closely.  Pt expressed understanding and is in agreement w/ plan.

## 2012-06-14 NOTE — Patient Instructions (Addendum)
Follow up in 3-4 weeks to recheck BP We'll notify you of your lab results Please call with any questions or concerns Think of [place] as your home base- if you need anything, let us know! Welcome!  We're glad to have you!

## 2012-06-14 NOTE — Assessment & Plan Note (Signed)
New to provider, ongoing for pt.  Most recent TSH in Jan WNL.  Will follow along and assist prn.

## 2012-06-14 NOTE — Assessment & Plan Note (Signed)
New to provider, ongoing for pt.  On daily PPI w/ good sxs control.  Seeing GI regularly.

## 2012-06-14 NOTE — Progress Notes (Signed)
  Subjective:    Patient ID: Erin Kaufman, female    DOB: November 23, 1970, 42 y.o.   MRN: [id number]  HPI New to establish.  PCP- none.  GYN- [facility]  GI- [facility]  Thyroid- Gerkin  Elevated BP- pt reports BP was high at GYN office x2.  Mother had hx of HTN.  Has 3 siblings and none of them have elevated BP.  Mom passed away last week.  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  No CP, SOB, HAs, visual changes, edema.  Multinodular goiter- following w/ Dr Gerrit Friends, on Synthroid daily.  Last checked in August.  GERD- chronic problem, on Nexium.  Denies current sxs.  Review of Systems For ROS see HPI     Objective:   Physical Exam  Constitutional: She is oriented to person, place, and time. She appears well-developed and well-nourished. No distress.  HENT:  Head: Normocephalic and atraumatic.  Eyes: Conjunctivae and EOM are normal. Pupils are equal, round, and reactive to light.  Neck: Normal range of motion. Neck supple. Thyromegaly present.  Cardiovascular: Normal rate, regular rhythm, normal heart sounds and intact distal pulses.   No murmur heard. Pulmonary/Chest: Effort normal and breath sounds normal. No respiratory distress.  Abdominal: Soft. She exhibits no distension. There is no tenderness.  Musculoskeletal: She exhibits no edema.  Lymphadenopathy:    She has no cervical adenopathy.  Neurological: She is alert and oriented to person, place, and time.  Skin: Skin is warm and dry.  Psychiatric: She has a normal mood and affect. Her behavior is normal.          Assessment & Plan:

## 2012-06-17 ENCOUNTER — Encounter: Payer: Self-pay | Admitting: *Deleted

## 2012-07-12 ENCOUNTER — Ambulatory Visit: Payer: 59 | Admitting: Family Medicine

## 2012-07-19 ENCOUNTER — Ambulatory Visit (INDEPENDENT_AMBULATORY_CARE_PROVIDER_SITE_OTHER): Payer: 59 | Admitting: Family Medicine

## 2012-07-19 ENCOUNTER — Encounter: Payer: Self-pay | Admitting: Family Medicine

## 2012-07-19 VITALS — BP 140/100 | HR 91 | Temp 98.1°F | Ht 62.25 in | Wt 180.0 lb

## 2012-07-19 DIAGNOSIS — R030 Elevated blood-pressure reading, without diagnosis of hypertension: Secondary | ICD-10-CM

## 2012-07-19 MED ORDER — HYDROCHLOROTHIAZIDE 12.5 MG PO TABS: 12.5000 mg | ORAL_TABLET | Freq: Every day | ORAL | Status: DC

## 2012-07-19 NOTE — Progress Notes (Signed)
  Subjective:    Patient ID: ANAE HAMS, female    DOB: [birth year], 42 y.o.   MRN: [id number]  HPI HTN- new dx based on today's reading.  + family hx.  No CP, SOB, HAs, visual changes, edema.  Has never taken meds.  + smoker   Review of Systems For ROS see HPI     Objective:   Physical Exam  Vitals reviewed. Constitutional: She is oriented to person, place, and time. She appears well-developed and well-nourished. No distress.  HENT:  Head: Normocephalic and atraumatic.  Eyes: Conjunctivae and EOM are normal. Pupils are equal, round, and reactive to light.  Neck: Normal range of motion. Neck supple. No thyromegaly present.  Cardiovascular: Normal rate, regular rhythm, normal heart sounds and intact distal pulses.   No murmur heard. Pulmonary/Chest: Effort normal and breath sounds normal. No respiratory distress.  Abdominal: Soft. She exhibits no distension. There is no tenderness.  Musculoskeletal: She exhibits no edema.  Lymphadenopathy:    She has no cervical adenopathy.  Neurological: She is alert and oriented to person, place, and time.  Skin: Skin is warm and dry.  Psychiatric: She has a normal mood and affect. Her behavior is normal.          Assessment & Plan:

## 2012-07-19 NOTE — Patient Instructions (Addendum)
Follow up in 1 month to recheck BP Start the Hydrochlorothiazide daily Limit your salt intake and try and quit smoking- this will help the BP Call with any questions or concerns Happy Mother's Day and Early Birthday!!!

## 2012-07-21 NOTE — Assessment & Plan Note (Signed)
New.  Start HCTZ based on 2 consecutive elevated readings.  Recheck BP and BMP in 1 month.  Encouraged low salt diet, regular exercise, and smoking cessation.  Will follow.

## 2012-08-22 ENCOUNTER — Encounter: Payer: Self-pay | Admitting: Family Medicine

## 2012-08-22 ENCOUNTER — Ambulatory Visit (INDEPENDENT_AMBULATORY_CARE_PROVIDER_SITE_OTHER): Payer: 59 | Admitting: Family Medicine

## 2012-08-22 VITALS — BP 120/90 | HR 96 | Temp 98.4°F | Ht 62.25 in | Wt 182.0 lb

## 2012-08-22 DIAGNOSIS — I10 Essential (primary) hypertension: Secondary | ICD-10-CM

## 2012-08-22 LAB — BASIC METABOLIC PANEL
Chloride: 104 mEq/L (ref 96–112)
Creatinine, Ser: 0.6 mg/dL (ref 0.4–1.2)

## 2012-08-22 NOTE — Patient Instructions (Addendum)
Follow up in 6 months to recheck BP Keep up the good work!  You look great! We'll notify you of your lab results and make any changes if needed Call with any questions or concerns Have a great summer!  Travel safe!

## 2012-08-22 NOTE — Assessment & Plan Note (Signed)
Improved since starting HCTZ.  Asymptomatic.  Check BMP.  Continue to follow.

## 2012-08-22 NOTE — Progress Notes (Signed)
  Subjective:    Patient ID: Erin Kaufman, female    DOB: 1970-11-12, 42 y.o.   MRN: [id number]  HPI HTN- new dx.  Started on HCTZ.  Denies fatigue, HAs, edema, CP, SOB.   Review of Systems For ROS see HPI     Objective:   Physical Exam  Vitals reviewed. Constitutional: She is oriented to person, place, and time. She appears well-developed and well-nourished. No distress.  HENT:  Head: Normocephalic and atraumatic.  Eyes: Conjunctivae and EOM are normal. Pupils are equal, round, and reactive to light.  Neck: Normal range of motion. Neck supple. No thyromegaly present.  Cardiovascular: Normal rate, regular rhythm, normal heart sounds and intact distal pulses.   No murmur heard. Pulmonary/Chest: Effort normal and breath sounds normal. No respiratory distress.  Abdominal: Soft. She exhibits no distension. There is no tenderness.  Musculoskeletal: She exhibits no edema.  Lymphadenopathy:    She has no cervical adenopathy.  Neurological: She is alert and oriented to person, place, and time.  Skin: Skin is warm and dry.  Psychiatric: She has a normal mood and affect. Her behavior is normal.          Assessment & Plan:

## 2012-08-23 ENCOUNTER — Encounter: Payer: Self-pay | Admitting: *Deleted

## 2012-10-14 ENCOUNTER — Telehealth (INDEPENDENT_AMBULATORY_CARE_PROVIDER_SITE_OTHER): Payer: Self-pay

## 2012-10-14 NOTE — Telephone Encounter (Signed)
LMOM pt due for tsh and appt with Dr Gerrit Friends. Received refill request for synthroid. Will route to Dr Gerrit Friends for review.

## 2012-10-14 NOTE — Telephone Encounter (Signed)
Per Dr Lindie Spruce since Dr Gerrit Friends off refill synthroid 125 mcg #30 refill x 3 [facility]ed to [facility] [phone]. Awaiting pt to call back and set up ltf appt with tsh.

## 2012-11-27 ENCOUNTER — Ambulatory Visit (INDEPENDENT_AMBULATORY_CARE_PROVIDER_SITE_OTHER): Payer: 59 | Admitting: Surgery

## 2012-11-27 ENCOUNTER — Encounter (INDEPENDENT_AMBULATORY_CARE_PROVIDER_SITE_OTHER): Payer: Self-pay | Admitting: Surgery

## 2012-11-27 VITALS — BP 110/68 | HR 72 | Resp 14 | Ht 61.0 in | Wt 178.6 lb

## 2012-11-27 DIAGNOSIS — E039 Hypothyroidism, unspecified: Secondary | ICD-10-CM

## 2012-11-27 DIAGNOSIS — E042 Nontoxic multinodular goiter: Secondary | ICD-10-CM

## 2012-11-27 MED ORDER — SYNTHROID 125 MCG PO TABS: 125.0000 ug | ORAL_TABLET | Freq: Every day | ORAL | Status: AC

## 2012-11-27 NOTE — Progress Notes (Signed)
General Surgery [facility]  Chief Complaint  Patient presents with  . Follow-up    multinodular thyroid goiter, hypothyroidism    HISTORY: Patient is a 42 year old female followed for multinodular thyroid goiter. Patient has had a small multinodular goiter monitored with sequential ultrasound exams. She also has hypothyroidism and is taking Synthroid 125 mcg daily. She will have a TSH level determined daily.  PERTINENT REVIEW OF SYSTEMS: Denies compressive symptoms. Denies tremor. Denies palpitations.  EXAM: HEENT: normocephalic; pupils equal and reactive; sclerae clear; dentition good; mucous membranes moist NECK:  Thyroid gland is in the upper range of normal in size; there is subtle nodularity but no dominant nor discrete masses are palpable; symmetric on extension; no palpable anterior or posterior cervical lymphadenopathy; no supraclavicular masses; no tenderness CHEST: clear to auscultation bilaterally without rales, rhonchi, or wheezes CARDIAC: regular rate and rhythm without significant murmur; peripheral pulses are full EXT:  non-tender without edema; no deformity NEURO: no gross focal deficits; no sign of tremor   IMPRESSION: #1 multinodular thyroid goiter, clinically stable #2 hypothyroidism  PLAN: I have reviewed her Synthroid prescription for one year. We will check her TSH level today to make sure she is still well within the normal range. Clinically her thyroid gland has not changed and I believe we can postpone thyroid ultrasound for the time being.  Patient will return in one year for examination.  Velora Heckler, MD, [facility] Office: [phone]  Visit Diagnoses: 1. Multiple thyroid nodules   2. Multinodular goiter (nontoxic)   3. HYPOTHYROIDISM

## 2012-11-27 NOTE — Patient Instructions (Signed)

## 2012-11-28 LAB — TSH: TSH: 0.942 u[IU]/mL (ref 0.350–4.500)

## 2012-12-02 ENCOUNTER — Telehealth (INDEPENDENT_AMBULATORY_CARE_PROVIDER_SITE_OTHER): Payer: Self-pay

## 2012-12-02 ENCOUNTER — Other Ambulatory Visit: Payer: 59

## 2012-12-02 NOTE — Telephone Encounter (Signed)
TSH 0.942 and result to Dr Gerrit Friends for review.

## 2012-12-09 ENCOUNTER — Telehealth (INDEPENDENT_AMBULATORY_CARE_PROVIDER_SITE_OTHER): Payer: Self-pay

## 2012-12-09 ENCOUNTER — Telehealth (INDEPENDENT_AMBULATORY_CARE_PROVIDER_SITE_OTHER): Payer: Self-pay | Admitting: Surgery

## 2012-12-09 NOTE — Telephone Encounter (Signed)
The pt returned the call and I let her know her TSH is fine and she can stay on her current dose.

## 2012-12-09 NOTE — Telephone Encounter (Signed)
Message copied by Joanette Gula on Mon Dec 09, 2012  9:23 AM ------      Message from: Velora Heckler      Created: Mon Dec 09, 2012  9:20 AM       Arline Asp,            Please call patient and let her know that TSH is fine.  Stay on present dose.            tmg            Velora Heckler, MD, [facility]      Office: [phone]                  ----- Message -----         From: [facility] Interface         Sent: 11/28/2012   2:15 AM           To: Velora Heckler, MD                   ------

## 2012-12-09 NOTE — Telephone Encounter (Signed)
LMOM for pt to call. Pt can be advised of attached msg from Dr Gerrit Friends.

## 2012-12-09 NOTE — Telephone Encounter (Signed)
TSH level is good on present dose of Synthroid 125 mcg daily.  No change in dosage.  Velora Heckler, MD, [facility] Office: [phone]

## 2013-02-21 ENCOUNTER — Encounter: Payer: Self-pay | Admitting: Family Medicine

## 2013-02-21 ENCOUNTER — Ambulatory Visit (INDEPENDENT_AMBULATORY_CARE_PROVIDER_SITE_OTHER): Payer: 59 | Admitting: Family Medicine

## 2013-02-21 ENCOUNTER — Ambulatory Visit: Payer: 59 | Admitting: Family Medicine

## 2013-02-21 VITALS — BP 112/70 | HR 99 | Temp 98.1°F | Resp 16 | Wt 174.4 lb

## 2013-02-21 DIAGNOSIS — I10 Essential (primary) hypertension: Secondary | ICD-10-CM

## 2013-02-21 LAB — BASIC METABOLIC PANEL WITH GFR
BUN: 10 mg/dL (ref 6–23)
CO2: 27 meq/L (ref 19–32)
Calcium: 9.6 mg/dL (ref 8.4–10.5)
Chloride: 101 meq/L (ref 96–112)
Creatinine, Ser: 0.7 mg/dL (ref 0.4–1.2)
GFR: 97.73 mL/min
Glucose, Bld: 68 mg/dL — ABNORMAL LOW (ref 70–99)
Potassium: 4 meq/L (ref 3.5–5.1)
Sodium: 135 meq/L (ref 135–145)

## 2013-02-21 NOTE — Patient Instructions (Signed)
Schedule your complete physical in 6 months We'll notify you of your lab results and make any changes if needed Keep up the good work!  You look great!!! Call with any questions or concerns Happy Holidays!! 

## 2013-02-21 NOTE — Assessment & Plan Note (Signed)
Pt's BP excellent today.  Given the option of stopping HCTZ vs continuing.  Pt elects to continue HCTZ.  Check BMP.  Reviewed supportive care and red flags that should prompt return.  Pt expressed understanding and is in agreement w/ plan.

## 2013-02-21 NOTE — Progress Notes (Signed)
   Subjective:    Patient ID: Erin Kaufman, female    DOB: 24-May-1970, 42 y.o.   MRN: [id number]  HPI HTN- chronic problem, on HCTZ daily.  No CP, SOB, HAs, visual changes, edema.   Review of Systems For ROS see HPI     Objective:   Physical Exam  Vitals reviewed. Constitutional: She is oriented to person, place, and time. She appears well-developed and well-nourished. No distress.  HENT:  Head: Normocephalic and atraumatic.  Eyes: Conjunctivae and EOM are normal. Pupils are equal, round, and reactive to light.  Neck: Normal range of motion. Neck supple.  Cardiovascular: Normal rate, regular rhythm, normal heart sounds and intact distal pulses.   No murmur heard. Pulmonary/Chest: Effort normal and breath sounds normal. No respiratory distress.  Abdominal: Soft. She exhibits no distension. There is no tenderness.  Musculoskeletal: She exhibits no edema.  Lymphadenopathy:    She has no cervical adenopathy.  Neurological: She is alert and oriented to person, place, and time.  Skin: Skin is warm and dry.  Psychiatric: She has a normal mood and affect. Her behavior is normal.          Assessment & Plan:

## 2013-02-21 NOTE — Progress Notes (Signed)
Pre visit review using our clinic review tool, if applicable. No additional management support is needed unless otherwise documented below in the visit note. 

## 2013-02-24 ENCOUNTER — Encounter: Payer: Self-pay | Admitting: General Practice

## 2013-03-29 ENCOUNTER — Other Ambulatory Visit: Payer: Self-pay | Admitting: Internal Medicine

## 2013-04-03 ENCOUNTER — Telehealth: Payer: Self-pay

## 2013-04-03 MED ORDER — DEXLANSOPRAZOLE 60 MG PO CPDR: 60.0000 mg | DELAYED_RELEASE_CAPSULE | Freq: Every day | ORAL | Status: DC

## 2013-04-03 NOTE — Telephone Encounter (Signed)
Patient's insurance is no longer covering Nexium so she agreed to try Platea and see how it works.  I told her I would send a month's supply to her pharmacy to try.  She agreed to let me know how she does with it.

## 2013-04-12 LAB — HM PAP SMEAR: HM Pap smear: NORMAL

## 2013-04-12 LAB — HM MAMMOGRAPHY: HM MAMMO: NORMAL

## 2013-05-01 ENCOUNTER — Other Ambulatory Visit: Payer: Self-pay | Admitting: Internal Medicine

## 2013-06-14 ENCOUNTER — Other Ambulatory Visit: Payer: Self-pay | Admitting: Family Medicine

## 2013-06-16 NOTE — Telephone Encounter (Signed)
Med filled.  

## 2013-08-21 ENCOUNTER — Telehealth: Payer: Self-pay

## 2013-08-21 NOTE — Telephone Encounter (Signed)
Medication and allergies:  Reviewed and updated  90 day supply/mail order: n/a Local pharmacy:  [id number] - [place], [place] - [address]   Immunizations due:  Td/tdap   A/P: No changes to personal, family history or past surgical hx PAP- 04/12/2013--normal per patient---Dr. Radene Knee at [facility]- 04/12/2013--normal per patient  Tdap- greater than 10 years ago   To Discuss with Provider: Pt has stopped taking the HCTZ.  She was given this option during the last office visit (08/22/12).

## 2013-08-22 ENCOUNTER — Encounter: Payer: Self-pay | Admitting: General Practice

## 2013-08-22 ENCOUNTER — Encounter: Payer: Self-pay | Admitting: Family Medicine

## 2013-08-22 ENCOUNTER — Ambulatory Visit (INDEPENDENT_AMBULATORY_CARE_PROVIDER_SITE_OTHER): Payer: 59 | Admitting: Family Medicine

## 2013-08-22 VITALS — BP 118/80 | HR 89 | Temp 98.3°F | Resp 16 | Ht 62.0 in | Wt 171.5 lb

## 2013-08-22 DIAGNOSIS — Z23 Encounter for immunization: Secondary | ICD-10-CM

## 2013-08-22 DIAGNOSIS — Z0000 Encounter for general adult medical examination without abnormal findings: Secondary | ICD-10-CM

## 2013-08-22 LAB — CBC WITH DIFFERENTIAL/PLATELET
Basophils Absolute: 0.1 10*3/uL (ref 0.0–0.1)
Basophils Relative: 0.8 % (ref 0.0–3.0)
EOS ABS: 0.1 10*3/uL (ref 0.0–0.7)
Eosinophils Relative: 1.8 % (ref 0.0–5.0)
HEMATOCRIT: 49 % — AB (ref 36.0–46.0)
HEMOGLOBIN: 16.7 g/dL — AB (ref 12.0–15.0)
LYMPHS ABS: 2.3 10*3/uL (ref 0.7–4.0)
Lymphocytes Relative: 32 % (ref 12.0–46.0)
MCHC: 34 g/dL (ref 30.0–36.0)
MCV: 93.6 fl (ref 78.0–100.0)
Monocytes Absolute: 0.7 10*3/uL (ref 0.1–1.0)
Monocytes Relative: 9.5 % (ref 3.0–12.0)
NEUTROS ABS: 4 10*3/uL (ref 1.4–7.7)
Neutrophils Relative %: 55.9 % (ref 43.0–77.0)
Platelets: 254 10*3/uL (ref 150.0–400.0)
RBC: 5.24 Mil/uL — ABNORMAL HIGH (ref 3.87–5.11)
RDW: 13.3 % (ref 11.5–15.5)
WBC: 7.1 10*3/uL (ref 4.0–10.5)

## 2013-08-22 LAB — LIPID PANEL
CHOLESTEROL: 209 mg/dL — AB (ref 0–200)
HDL: 37.7 mg/dL — ABNORMAL LOW (ref >39.00)
LDL CALC: 147 mg/dL — AB (ref 0–99)
NonHDL: 171.3
Total CHOL/HDL Ratio: 6
Triglycerides: 121 mg/dL (ref 0.0–149.0)
VLDL: 24.2 mg/dL (ref 0.0–40.0)

## 2013-08-22 LAB — BASIC METABOLIC PANEL
BUN: 7 mg/dL (ref 6–23)
CO2: 30 mEq/L (ref 19–32)
Calcium: 9.7 mg/dL (ref 8.4–10.5)
Chloride: 102 mEq/L (ref 96–112)
Creatinine, Ser: 0.6 mg/dL (ref 0.4–1.2)
GFR: 116.48 mL/min (ref >60.00)
GLUCOSE: 74 mg/dL (ref 70–99)
POTASSIUM: 4.6 meq/L (ref 3.5–5.1)
SODIUM: 138 meq/L (ref 135–145)

## 2013-08-22 LAB — HEPATIC FUNCTION PANEL
ALK PHOS: 69 U/L (ref 39–117)
ALT: 18 U/L (ref 0–35)
AST: 17 U/L (ref 0–37)
Albumin: 3.9 g/dL (ref 3.5–5.2)
BILIRUBIN DIRECT: 0 mg/dL (ref 0.0–0.3)
TOTAL PROTEIN: 7.5 g/dL (ref 6.0–8.3)
Total Bilirubin: 0.6 mg/dL (ref 0.2–1.2)

## 2013-08-22 LAB — TSH: TSH: 0.6 u[IU]/mL (ref 0.35–4.50)

## 2013-08-22 LAB — VITAMIN D 25 HYDROXY (VIT D DEFICIENCY, FRACTURES): VITD: 41.29 ng/mL

## 2013-08-22 NOTE — Patient Instructions (Signed)
Follow up in 1 year or as needed We'll notify you of your lab results and make any changes if needed Keep up the good work!  You look great! Call with any questions or concerns Have a great summer!!! 

## 2013-08-22 NOTE — Assessment & Plan Note (Signed)
Pt's PE WNL.  UTD on GYN.  Tetanus updated today.  Check labs.  Anticipatory guidance provided.

## 2013-08-22 NOTE — Progress Notes (Signed)
Pre visit review using our clinic review tool, if applicable. No additional management support is needed unless otherwise documented below in the visit note. 

## 2013-08-22 NOTE — Addendum Note (Signed)
Addended by: Kris Hartmann on: 08/22/2013 10:42 AM   Modules accepted: Orders

## 2013-08-22 NOTE — Progress Notes (Signed)
   Subjective:    Patient ID: Erin Kaufman, female    DOB: 1970/07/08, 43 y.o.   MRN: [id number]  HPI CPE- UTD on GYN.  No current concerns.   Review of Systems Patient reports no vision/ hearing changes, adenopathy,fever, weight change,  persistant/recurrent hoarseness , swallowing issues, chest pain, palpitations, edema, persistant/recurrent cough, hemoptysis, dyspnea (rest/exertional/paroxysmal nocturnal), gastrointestinal bleeding (melena, rectal bleeding), abdominal pain, significant heartburn, bowel changes, GU symptoms (dysuria, hematuria, incontinence), Gyn symptoms (abnormal  bleeding, pain),  syncope, focal weakness, memory loss, numbness & tingling, skin/hair/nail changes, abnormal bruising or bleeding, anxiety, or depression.     Objective:   Physical Exam General Appearance:    Alert, cooperative, no distress, appears stated age  Head:    Normocephalic, without obvious abnormality, atraumatic  Eyes:    PERRL, conjunctiva/corneas clear, EOM's intact, fundi    benign, both eyes  Ears:    Normal TM's and external ear canals, both ears  Nose:   Nares normal, septum midline, mucosa normal, no drainage    or sinus tenderness  Throat:   Lips, mucosa, and tongue normal; teeth and gums normal  Neck:   Supple, symmetrical, trachea midline, no adenopathy;    Thyroid: no enlargement/tenderness/nodules  Back:     Symmetric, no curvature, ROM normal, no CVA tenderness  Lungs:     Clear to auscultation bilaterally, respirations unlabored  Chest Wall:    No tenderness or deformity   Heart:    Regular rate and rhythm, S1 and S2 normal, no murmur, rub   or gallop  Breast Exam:    Deferred to GYN  Abdomen:     Soft, non-tender, bowel sounds active all four quadrants,    no masses, no organomegaly  Genitalia:    Deferred to GYN  Rectal:    Extremities:   Extremities normal, atraumatic, no cyanosis or edema  Pulses:   2+ and symmetric all extremities  Skin:   Skin color, texture,  turgor normal, no rashes or lesions  Lymph nodes:   Cervical, supraclavicular, and axillary nodes normal  Neurologic:   CNII-XII intact, normal strength, sensation and reflexes    throughout          Assessment & Plan:

## 2014-02-23 ENCOUNTER — Other Ambulatory Visit (INDEPENDENT_AMBULATORY_CARE_PROVIDER_SITE_OTHER): Payer: Self-pay

## 2014-02-23 DIAGNOSIS — E042 Nontoxic multinodular goiter: Secondary | ICD-10-CM

## 2014-06-09 ENCOUNTER — Other Ambulatory Visit: Payer: Self-pay | Admitting: Obstetrics and Gynecology

## 2014-06-10 LAB — CYTOLOGY - PAP

## 2015-07-16 ENCOUNTER — Encounter: Payer: Self-pay | Admitting: Internal Medicine

## 2015-08-24 NOTE — Progress Notes (Signed)
Cardiology Office Note   Date:  08/24/2015   ID:  Erin Kaufman, DOB [birth year], MRN [id number]  PCP:  Darlyn Chamber, MD  Cardiologist:   Dorris Carnes, MD   No chief complaint on file.  Pt referred by Dr Radene Knee for cardiac risk assessment    History of Present Illness: Erin Kaufman is a 45 y.o. female with no history of coronary artery dz  FHX of mother with CAD  Had cardiac work up in past   Pt is a smoker    She denies CP  Breathing is OK  No SOB She is active  3 kids  Works Smoking 1 ppd  Since teens  Not really interested in stopping  Last LDL 2 year aog was 147    Outpatient Prescriptions Prior to Visit  Medication Sig Dispense Refill  . aspirin 325 MG tablet Take 325 mg by mouth daily.    . Multiple Vitamins-Minerals (CENTRUM PO) Take 1 tablet by mouth daily as needed.     Marland Kitchen NEXIUM 40 MG capsule TAKE 1 CAPSULE (40 MG TOTAL) BY MOUTH DAILY. 30 capsule 6  . SYNTHROID 125 MCG tablet Take 1 tablet (125 mcg total) by mouth daily. 30 tablet 11   No facility-administered medications prior to visit.     Allergies:   Penicillins   Past Medical History  Diagnosis Date  . Hiatal hernia   . Thyroid disease   . GERD (gastroesophageal reflux disease)   . Anxiety   . Hypertension     Past Surgical History  Procedure Laterality Date  . Abdominal hysterectomy  2007     Social History:  The patient  reports that she has been smoking Cigarettes.  She has never used smokeless tobacco. She reports that she drinks alcohol. She reports that she does not use illicit drugs.   Family History:  The patient's family history includes Brain cancer in her mother; Diabetes in her mother; Heart disease in her mother; Hypertension in her mother; Lymphoma in her mother; Prostate cancer in her father. There is no history of Colon cancer.    ROS:  Please see the history of present illness. All other systems are reviewed and  Negative to the above problem except as noted.     PHYSICAL EXAM: VS:  There were no vitals taken for this visit.  GEN: Well nourished, well developed, in no acute distress HEENT: normal Neck: no JVD, carotid bruits, or masses Cardiac: RRR; no murmurs, rubs, or gallops,no edema  Respiratory:  clear to auscultation bilaterally, normal work of breathing GI: soft, nontender, nondistended, + BS  No hepatomegaly  MS: no deformity Moving all extremities   Skin: warm and dry, no rash Neuro:  Strength and sensation are intact Psych: euthymic mood, full affect   EKG:  EKG is ordered today.  SR 91 bpm    Lipid Panel    Component Value Date/Time   CHOL 209* 08/22/2013 1047   TRIG 121.0 08/22/2013 1047   HDL 37.70* 08/22/2013 1047   CHOLHDL 6 08/22/2013 1047   VLDL 24.2 08/22/2013 1047   LDLCALC 147* 08/22/2013 1047      Wt Readings from Last 3 Encounters:  08/22/13 171 lb 8 oz (77.792 kg)  02/21/13 174 lb 6 oz (79.096 kg)  11/27/12 178 lb 9.6 oz (81.012 kg)      ASSESSMENT AND PLAN: 1  Cardiac risk assessment Pt has risks for CAD  Particularly FHx and tob use .  No symptoms  I would recomm a cardiac Ca score to eval burden  WIth continued smoking I would treat other risks aggressively Rx lipids    2  Dyslipidemia  I would reocmm 5 of Crestor with repeat lipids in 2 months    2.  Tobacco  Counselled on cessation  Pt not eager to quit now  4  HCM  Encouraged her to stay active  Try to lose wt  Tentative f/u in 1 year  Will get back to her on test results       Signed, Dorris Carnes, MD  08/24/2015 7:23 PM    [facility] [place] [address], [place], [place]  [address] Phone: [phone]; Fax: [phone]

## 2015-08-25 ENCOUNTER — Ambulatory Visit (INDEPENDENT_AMBULATORY_CARE_PROVIDER_SITE_OTHER): Payer: 59 | Admitting: Internal Medicine

## 2015-08-25 ENCOUNTER — Ambulatory Visit (INDEPENDENT_AMBULATORY_CARE_PROVIDER_SITE_OTHER)
Admission: RE | Admit: 2015-08-25 | Discharge: 2015-08-25 | Disposition: A | Discharge status: Home / Self Care | Payer: 59 | Source: Ambulatory Visit | Attending: Internal Medicine | Admitting: Internal Medicine

## 2015-08-25 ENCOUNTER — Other Ambulatory Visit: Payer: Self-pay

## 2015-08-25 ENCOUNTER — Encounter: Payer: Self-pay | Admitting: Internal Medicine

## 2015-08-25 VITALS — BP 124/78 | HR 91 | Ht 61.0 in | Wt 178.1 lb

## 2015-08-25 DIAGNOSIS — I10 Essential (primary) hypertension: Secondary | ICD-10-CM

## 2015-08-25 MED ORDER — ROSUVASTATIN CALCIUM 5 MG PO TABS: 5.0000 mg | ORAL_TABLET | Freq: Every day | ORAL | Status: DC

## 2015-08-25 NOTE — Patient Instructions (Signed)
Your physician has recommended you make the following change in your medication:  1.) start Crestor (rosuvastatin) 5mg  once daily for cholesterol  Your physician recommends that you return for lab work in: 2 months (lipids).  Please be fasting for this blood work.  Please schedule your calcium score CT scan at checkout. There is a $150 out of pocket cost for this test.  Your physician wants you to follow-up in: 1 year with Dr. Harrington Challenger.  You will receive a reminder letter in the mail two months in advance. If you don't receive a letter, please call our office to schedule the follow-up appointment.

## 2015-08-31 ENCOUNTER — Encounter: Payer: Self-pay | Admitting: Internal Medicine

## 2015-09-15 ENCOUNTER — Other Ambulatory Visit: Payer: Self-pay | Admitting: *Deleted

## 2015-09-15 DIAGNOSIS — R911 Solitary pulmonary nodule: Secondary | ICD-10-CM

## 2015-10-25 ENCOUNTER — Other Ambulatory Visit: Payer: 59

## 2016-06-15 ENCOUNTER — Inpatient Hospital Stay: Admission: RE | Admit: 2016-06-15 | Payer: 59 | Source: Ambulatory Visit

## 2016-07-04 ENCOUNTER — Ambulatory Visit (INDEPENDENT_AMBULATORY_CARE_PROVIDER_SITE_OTHER)
Admission: RE | Admit: 2016-07-04 | Discharge: 2016-07-04 | Disposition: A | Discharge status: Home / Self Care | Payer: 59 | Source: Ambulatory Visit | Attending: Internal Medicine | Admitting: Internal Medicine

## 2016-07-04 DIAGNOSIS — R911 Solitary pulmonary nodule: Secondary | ICD-10-CM | POA: Diagnosis not present

## 2016-07-04 MED ORDER — IOPAMIDOL (ISOVUE-370) INJECTION 76%
100.0000 mL | Freq: Once | INTRAVENOUS | Status: AC | PRN
  Administered 2016-07-04: 100 mL via INTRAVENOUS

## 2016-07-06 ENCOUNTER — Other Ambulatory Visit: Payer: Self-pay

## 2016-07-06 MED ORDER — ROSUVASTATIN CALCIUM 5 MG PO TABS: 5.0000 mg | ORAL_TABLET | Freq: Every day | ORAL | 0 refills | Status: DC

## 2016-08-28 ENCOUNTER — Ambulatory Visit: Payer: 59 | Admitting: Internal Medicine

## 2016-10-02 ENCOUNTER — Other Ambulatory Visit: Payer: Self-pay | Admitting: Internal Medicine

## 2016-10-28 ENCOUNTER — Other Ambulatory Visit: Payer: Self-pay | Admitting: Internal Medicine

## 2016-12-04 ENCOUNTER — Ambulatory Visit (INDEPENDENT_AMBULATORY_CARE_PROVIDER_SITE_OTHER): Payer: 59 | Admitting: Internal Medicine

## 2016-12-04 ENCOUNTER — Encounter: Payer: Self-pay | Admitting: Internal Medicine

## 2016-12-04 VITALS — BP 142/94 | HR 93 | Ht 61.0 in | Wt 171.1 lb

## 2016-12-04 DIAGNOSIS — Z720 Tobacco use: Secondary | ICD-10-CM

## 2016-12-04 DIAGNOSIS — I10 Essential (primary) hypertension: Secondary | ICD-10-CM

## 2016-12-04 DIAGNOSIS — I712 Thoracic aortic aneurysm, without rupture, unspecified: Secondary | ICD-10-CM

## 2016-12-04 DIAGNOSIS — E782 Mixed hyperlipidemia: Secondary | ICD-10-CM | POA: Diagnosis not present

## 2016-12-04 DIAGNOSIS — E785 Hyperlipidemia, unspecified: Secondary | ICD-10-CM

## 2016-12-04 MED ORDER — ROSUVASTATIN CALCIUM 5 MG PO TABS: 5.0000 mg | ORAL_TABLET | Freq: Every day | ORAL | 3 refills | Status: DC

## 2016-12-04 MED ORDER — ASPIRIN EC 81 MG PO TBEC: 81.0000 mg | DELAYED_RELEASE_TABLET | Freq: Every day | ORAL | 3 refills | Status: DC

## 2016-12-04 NOTE — Progress Notes (Signed)
Cardiology Office Note   Date:  12/04/2016   ID:  Erin Kaufman, DOB 11/14/1970, MRN [id number]  PCP:  Arvella Nigh, MD  Cardiologist:   Dorris Carnes, MD   Pt presents for f/u of cardiac risks and also thoracic aneurysm      History of Present Illness: Erin Kaufman is a 46 y.o. female with no history of coronary artery dz  FHX of mother with CADI saw her in 2017 for first time  She has [place] of CAD   She smoked  LDL was 147  No CP at time    After I saw her I recomm a Ca score  This was 0  Aorta noted to e 57 mmg   REcomm ASAandCRestor  Since seen, She ran out of crestor  DIdnt know she was supposed to continue taking it   She continues to smoke 1 ppd  Stressed    Denies CP  Denies SOB    Outpatient Medications Prior to Visit  Medication Sig Dispense Refill  . aspirin 325 MG tablet Take 325 mg by mouth daily.    Marland Kitchen NEXIUM 40 MG capsule TAKE 1 CAPSULE (40 MG TOTAL) BY MOUTH DAILY. 30 capsule 6  . SYNTHROID 125 MCG tablet Take 1 tablet (125 mcg total) by mouth daily. 30 tablet 11  . Vitamin D, Ergocalciferol, (DRISDOL) 50000 units CAPS capsule Take 1 capsule by mouth once a week.  0  . rosuvastatin (CRESTOR) 5 MG tablet TAKE 1 TABLET (5 MG TOTAL) BY MOUTH DAILY. (Patient not taking: Reported on 12/04/2016) 30 tablet 0  . rosuvastatin (CRESTOR) 5 MG tablet TAKE 1 TABLET (5 MG TOTAL) BY MOUTH DAILY. (Patient not taking: Reported on 12/04/2016) 30 tablet 0   No facility-administered medications prior to visit.      Allergies:   Penicillins   Past Medical History:  Diagnosis Date  . Anxiety   . GERD (gastroesophageal reflux disease)   . Hiatal hernia   . Hypertension   . Thyroid disease     Past Surgical History:  Procedure Laterality Date  . ABDOMINAL HYSTERECTOMY  2007     Social History:  The patient  reports that she has been smoking Cigarettes.  She has never used smokeless tobacco. She reports that she drinks alcohol. She reports that she does not use drugs.     Family History:  The patient's family history includes Brain cancer in her mother; Diabetes in her mother; Heart disease in her mother; Hypertension in her mother; Lymphoma in her mother; Prostate cancer in her father.    ROS:  Please see the history of present illness. All other systems are reviewed and  Negative to the above problem except as noted.    PHYSICAL EXAM: VS:  BP (!) 142/94   Pulse 93   Ht 5\' 1"  (1.549 m)   Wt 171 lb 1.9 oz (77.6 kg)   BMI 32.33 kg/m   GEN: Well nourished, well developed, in no acute distress  HEENT: normal  Neck: JVP normal NO, carotid bruits, or masses Cardiac: RRR; no murmurs, rubs, or gallops,no edema  Respiratory:  clear to auscultation bilaterally, normal work of breathing  Rhonchi   GI: soft, nontender, nondistended, + BS  No hepatomegaly  MS: no deformity Moving all extremities   Skin: warm and dry, no rash Neuro:  Strength and sensation are intact Psych: euthymic mood, full affect   EKG:  EKG is ordered today.  SR 93 bpm  Lipid Panel    Component Value Date/Time   CHOL 209 (H) 08/22/2013 1047   TRIG 121.0 08/22/2013 1047   HDL 37.70 (L) 08/22/2013 1047   CHOLHDL 6 08/22/2013 1047   VLDL 24.2 08/22/2013 1047   LDLCALC 147 (H) 08/22/2013 1047      Wt Readings from Last 3 Encounters:  12/04/16 171 lb 1.9 oz (77.6 kg)  08/25/15 178 lb 1.9 oz (80.8 kg)  08/22/13 171 lb 8 oz (77.8 kg)      ASSESSMENT AND PLAN: 1  Cardiac risk assessment Pt has risks for CAD No calcifications but aortic aneurysm.  She continues to smoke  I counselled on cessation  Need tighter control of BP  Recomm she increase activity Finally, recomm she resume Crestor  Recomm 81 mg ECASA   2  Dyslipidemia  I would reocmm resuming  5 of Crestor with repeat lipids in 2 months   3.  Tobacco  Counselled again  on cessation  4  HTN  BP is elevated  I recomm she keep a log and return in January with lisk of readings  Bring BP cuff 5  HCM  Encouraged her to stay  active  Try to lose wt  Tentative f/u in Jan   Signed, Geneva Barrero, MD  12/04/2016 10:27 AM    [facility] [address], [place], [place]  [address] Phone: [phone]; Fax: [phone]

## 2016-12-04 NOTE — Patient Instructions (Signed)
Your physician has recommended you make the following change in your medication:  1.) change aspirin to 81 mg once a day --Enteric Coated (EC), not chewable 2.) restart rosuvastatin (Crestor) 5 mg once a day for cholesterol  Your physician recommends that you return for lab work in: 8 weeks (lipids)  Your physician wants you to follow-up in: January, 2019 with Dr. Harrington Challenger. You will receive a reminder letter in the mail two months in advance. If you don't receive a letter, please call our office to schedule the follow-up appointment. Bring your blood pressure cuff and list of blood pressure readings to this appointment.

## 2016-12-19 ENCOUNTER — Telehealth: Payer: Self-pay | Admitting: Internal Medicine

## 2016-12-19 NOTE — Telephone Encounter (Signed)
Left message for patient to call back  

## 2016-12-19 NOTE — Telephone Encounter (Signed)
Patient has been checking BP at home since last OV. (142/94 here) It has been running 130s over 80s-90s most of the time at home. Yesterday BP was high all day (see below) and she had a headache and was tired. Today feels much better even though this am BP was 116/110.  Checked after lunch at work and was back down to 140s/80s. I asked her to cut out salt and drink water and continue to check BP wed thurs and call me back Friday with readings.  Advised to sit for 5 minutes quietly each time before checking.

## 2016-12-19 NOTE — Telephone Encounter (Signed)
New message   Patient thinks medication needs adjustment because of high readings.  She was not sure what medication she takes for high BP. Please call.  Pt c/o BP issue: STAT if pt c/o blurred vision, one-sided weakness or slurred speech  1. What are your last 5 BP readings? 139/102, 166/110, 168/106, 160/102  2. Are you having any other symptoms (ex. Dizziness, headache, blurred vision, passed out)? Headache 10/8, none today  3. What is your BP issue? Patient states her medication needs to be changed.

## 2016-12-19 NOTE — Telephone Encounter (Signed)
New Message ° ° pt verbalized that she is returning call for rn °

## 2016-12-20 NOTE — Telephone Encounter (Signed)
I would recomm that she start Rx with 1/2 of Maxzide 37.5/25    F/U with BMET in 10 days F/U in clinic in 4 to 6 wks

## 2016-12-22 NOTE — Telephone Encounter (Signed)
Patient is feeling much better.  She thinks she ate bad over the weekend and Monday was a really stressful day for her. BP running 130s/78-85. She is aware Dr. Harrington Challenger may still want her to take a medication and I will verify this with her, since BP is lower.   I will call patient back if she is to start medicine.

## 2016-12-24 NOTE — Telephone Encounter (Signed)
Would like her to continue to follow  Goal for BP is on average less than 130/80

## 2016-12-25 NOTE — Telephone Encounter (Signed)
Left detailed message (DPR) to continue to monitor BP and that her goal is on average <130/80.  Advised to call back with any questions or concerns.

## 2017-01-29 ENCOUNTER — Other Ambulatory Visit: Payer: 59

## 2017-01-31 ENCOUNTER — Other Ambulatory Visit: Payer: 59 | Admitting: *Deleted

## 2017-01-31 ENCOUNTER — Encounter: Payer: Self-pay | Admitting: *Deleted

## 2017-01-31 ENCOUNTER — Encounter (INDEPENDENT_AMBULATORY_CARE_PROVIDER_SITE_OTHER): Payer: Self-pay

## 2017-01-31 DIAGNOSIS — E785 Hyperlipidemia, unspecified: Secondary | ICD-10-CM

## 2017-01-31 LAB — LIPID PANEL
Chol/HDL Ratio: 3.9 ratio (ref 0.0–4.4)
Cholesterol, Total: 191 mg/dL (ref 100–199)
HDL: 49 mg/dL (ref >39)
LDL Calculated: 116 mg/dL — ABNORMAL HIGH (ref 0–99)
Triglycerides: 130 mg/dL (ref 0–149)
VLDL Cholesterol Cal: 26 mg/dL (ref 5–40)

## 2017-01-31 NOTE — Progress Notes (Signed)
Received list of blood pressures for patient.  Will have scanned into Dr. Alan Ripper basket for review.

## 2017-02-06 ENCOUNTER — Telehealth: Payer: Self-pay

## 2017-02-06 DIAGNOSIS — I719 Aortic aneurysm of unspecified site, without rupture: Secondary | ICD-10-CM

## 2017-02-06 MED ORDER — ROSUVASTATIN CALCIUM 10 MG PO TABS: 10.0000 mg | ORAL_TABLET | Freq: Every day | ORAL | 3 refills | Status: DC

## 2017-02-06 NOTE — Telephone Encounter (Signed)
Informed patient of results and verbal understanding expressed.  Instructed patient to INCREASE CRESTOR to 10 mg daily. FLP scheduled 05/09/17. Patient agrees with treatment plan.

## 2017-02-06 NOTE — Telephone Encounter (Signed)
-----   Message from Luna Pier, MD sent at 02/05/2017 11:49 AM EST ----- LDL better at 116  But, with dilation of aorta I would recomm increasing Crestor to 10   Do not want atherosclerosis to develop F/U lipids in  3 months

## 2017-03-13 HISTORY — PX: FINGER SURGERY: SHX640

## 2017-05-07 DIAGNOSIS — I8312 Varicose veins of left lower extremity with inflammation: Secondary | ICD-10-CM | POA: Diagnosis not present

## 2017-05-07 DIAGNOSIS — I8311 Varicose veins of right lower extremity with inflammation: Secondary | ICD-10-CM | POA: Diagnosis not present

## 2017-05-09 ENCOUNTER — Other Ambulatory Visit: Payer: 59

## 2017-05-10 ENCOUNTER — Other Ambulatory Visit: Payer: 59 | Admitting: *Deleted

## 2017-05-10 DIAGNOSIS — I719 Aortic aneurysm of unspecified site, without rupture: Secondary | ICD-10-CM | POA: Diagnosis not present

## 2017-05-10 LAB — LIPID PANEL
CHOLESTEROL TOTAL: 172 mg/dL (ref 100–199)
Chol/HDL Ratio: 4.1 ratio (ref 0.0–4.4)
HDL: 42 mg/dL (ref >39)
LDL CALC: 105 mg/dL — AB (ref 0–99)
TRIGLYCERIDES: 124 mg/dL (ref 0–149)
VLDL Cholesterol Cal: 25 mg/dL (ref 5–40)

## 2017-05-23 ENCOUNTER — Other Ambulatory Visit: Payer: Self-pay | Admitting: *Deleted

## 2017-05-23 DIAGNOSIS — E785 Hyperlipidemia, unspecified: Secondary | ICD-10-CM

## 2017-05-23 MED ORDER — ROSUVASTATIN CALCIUM 20 MG PO TABS: 20.0000 mg | ORAL_TABLET | Freq: Every day | ORAL | 3 refills | Status: DC

## 2017-06-06 ENCOUNTER — Telehealth: Payer: Self-pay | Admitting: Internal Medicine

## 2017-06-06 NOTE — Telephone Encounter (Signed)
New Message   Pt c/o BP issue:  1. What are your last 5 BP readings? 154/100 (yesterday) 2. Are you having any other symptoms (ex. Dizziness, headache, blurred vision, passed out)? No  3. What is your medication issue? No medication issue. Patient concer that her BP has been high but has not checked it today.

## 2017-06-06 NOTE — Telephone Encounter (Signed)
Spoke with patient who has a concern about her Bps.. The most recent reading was 154/100 last evening with no symptoms..   She said that it has been running high for about 2 weeks.Marland Kitchen She will keep a log until she has a f/u on 4/8 with Daune Perch.Marland Kitchen

## 2017-06-13 DIAGNOSIS — Z1231 Encounter for screening mammogram for malignant neoplasm of breast: Secondary | ICD-10-CM | POA: Diagnosis not present

## 2017-06-13 DIAGNOSIS — Z01419 Encounter for gynecological examination (general) (routine) without abnormal findings: Secondary | ICD-10-CM | POA: Diagnosis not present

## 2017-06-13 DIAGNOSIS — Z6832 Body mass index (BMI) 32.0-32.9, adult: Secondary | ICD-10-CM | POA: Diagnosis not present

## 2017-06-17 DIAGNOSIS — I10 Essential (primary) hypertension: Secondary | ICD-10-CM | POA: Insufficient documentation

## 2017-06-17 DIAGNOSIS — E785 Hyperlipidemia, unspecified: Secondary | ICD-10-CM | POA: Insufficient documentation

## 2017-06-17 DIAGNOSIS — Z720 Tobacco use: Secondary | ICD-10-CM | POA: Insufficient documentation

## 2017-06-17 NOTE — Progress Notes (Signed)
Cardiology Office Note:    Date:  06/18/2017   ID:  Erin Kaufman, DOB [birth year], MRN [id number]  PCP:  Arvella Nigh, MD  Cardiologist:  Dorris Carnes, MD  Referring MD: Arvella Nigh, MD   Chief Complaint  Patient presents with  . Follow-up    BP FOLLOW UP    History of Present Illness:    Erin Kaufman is a 47 y.o. female with a past medical history significant for thyroid disease on Synthroid, hypertension, hiatal hernia, GERD, family history of CAD and anxiety.  She had a coronary CTA with calcium score of 0 in 08/2015 that did show aneurysmal dilatation of the acsending aorta at 44 mm.  Follow-up CTA in 06/2016 showed a measurement of 4.2 cm, stable.  She is treated with aspirin and statin.  She has a history of smoking 1 pack/day.  Erin Kaufman recently called in for concerns of elevated blood pressure 154/100.  Blood pressure is currently untreated. She is here today alone. Home blood pressure log with readings 130's-160's/90's-103. She denies any chest discomfort, dyspnea, lightheadedness, headaches, orthopnea, edema or activity intolerance. She exercises 3-4 times per week with a trainer in the home gym. Tries to eat fruits and vegetables, salads. Doesn't really count sodium but tries to avoid processed foods.    Past Medical History:  Diagnosis Date  . Anxiety   . GERD (gastroesophageal reflux disease)   . Hiatal hernia   . Hypertension   . Thyroid disease     Past Surgical History:  Procedure Laterality Date  . ABDOMINAL HYSTERECTOMY  2007    Current Medications: Current Meds  Medication Sig  . aspirin EC 81 MG tablet Take 1 tablet (81 mg total) by mouth daily.  . cholecalciferol (VITAMIN D) 1000 units tablet Take 1,000 Units by mouth daily.  Marland Kitchen NEXIUM 40 MG capsule TAKE 1 CAPSULE (40 MG TOTAL) BY MOUTH DAILY.  . rosuvastatin (CRESTOR) 20 MG tablet Take 1 tablet (20 mg total) by mouth daily.  Marland Kitchen SYNTHROID 125 MCG tablet Take 1 tablet (125 mcg total) by mouth  daily.     Allergies:   Penicillins   Social History   Socioeconomic History  . Marital status: Married    Spouse name: Not on file  . Number of children: Not on file  . Years of education: Not on file  . Highest education level: Not on file  Occupational History  . Not on file  Social Needs  . Financial resource strain: Not on file  . Food insecurity:    Worry: Not on file    Inability: Not on file  . Transportation needs:    Medical: Not on file    Non-medical: Not on file  Tobacco Use  . Smoking status: Current Some Day Smoker    Types: Cigarettes  . Smokeless tobacco: Never Used  . Tobacco comment: form given 03-20-12  Substance and Sexual Activity  . Alcohol use: Yes    Comment: socially  . Drug use: No  . Sexual activity: Not on file  Lifestyle  . Physical activity:    Days per week: Not on file    Minutes per session: Not on file  . Stress: Not on file  Relationships  . Social connections:    Talks on phone: Not on file    Gets together: Not on file    Attends religious service: Not on file    Active member of club or organization: Not on file  Attends meetings of clubs or organizations: Not on file    Relationship status: Not on file  Other Topics Concern  . Not on file  Social History Narrative  . Not on file     Family History: The patient's family history includes Brain cancer in her mother; Diabetes in her mother; Heart disease in her mother; Hypertension in her mother; Lymphoma in her mother; Prostate cancer in her father. There is no history of Colon cancer. ROS:   Please see the history of present illness.     All other systems reviewed and are negative.  EKGs/Labs/Other Studies Reviewed:    The following studies were reviewed today:  CTA chest 07/04/16 Maximal diameter of the ascending aorta is not significantly changed and today measures 4.2 cm. Recommend annual imaging followup by CTA or MRA. This recommendation follows  2010 ACCF/AHA/AATS/ACR/ASA/SCA/SCAI/SIR/STS/SVM Guidelines for the Diagnosis and Management of Patients with Thoracic Aortic Disease. Circulation. 2010; 121: R604-V409  Stable right middle lobe 5 mm pulmonary nodule supporting benign etiology.  EKG:  EKG is not ordered today.   Recent Labs: No results found for requested labs within last 8760 hours.   Recent Lipid Panel    Component Value Date/Time   CHOL 172 05/10/2017 0859   TRIG 124 05/10/2017 0859   HDL 42 05/10/2017 0859   CHOLHDL 4.1 05/10/2017 0859   CHOLHDL 6 08/22/2013 1047   VLDL 24.2 08/22/2013 1047   LDLCALC 105 (H) 05/10/2017 0859    Physical Exam:    VS:  BP (!) 138/98   Pulse 88   Ht 5\' 1"  (1.549 m)   Wt 177 lb (80.3 kg)   BMI 33.44 kg/m     Wt Readings from Last 3 Encounters:  06/18/17 177 lb (80.3 kg)  12/04/16 171 lb 1.9 oz (77.6 kg)  08/25/15 178 lb 1.9 oz (80.8 kg)     Physical Exam  Constitutional: She is oriented to person, place, and time. She appears well-developed and well-nourished. No distress.  HENT:  Head: Normocephalic and atraumatic.  Neck: Normal range of motion. Neck supple. No JVD present.  Cardiovascular: Normal rate, regular rhythm and normal heart sounds. Exam reveals no gallop and no friction rub.  No murmur heard. Pulmonary/Chest: Effort normal and breath sounds normal. No respiratory distress. She has no wheezes. She has no rales.  Abdominal: Soft. Bowel sounds are normal. She exhibits no distension.  Musculoskeletal: Normal range of motion. She exhibits no edema.  Neurological: She is alert and oriented to person, place, and time.  Skin: Skin is warm and dry.  Psychiatric: She has a normal mood and affect. Her behavior is normal. Thought content normal.    ASSESSMENT:    1. Essential (primary) hypertension   2. Hyperlipidemia, unspecified hyperlipidemia type   3. Tobacco abuse   4. Thoracic aortic aneurysm without rupture (HCC)    PLAN:    In order of problems  listed above:  Hypertension: Stage 2. BP elevated at last visit, today and on home log. Will initiate lisinopril 10 mg daily. Pt instructed to continue to monitor her pressure and if continues to be elevated above Goal of <130/80, she can call and will increase dose to 20 mg.  Advise low sodium heart healthy diet and exercise.   Hyperlipidemia: Crestor was increased to 10 mg daily for an elevated LDL of 116.  Want to prevent atherosclerosis in setting of dilated aorta.  Last lipid panel in February/2019 showed a reduction in LDL to 105.  Ascending  aorta dilatation: Has been stable with last imaging in 06/2016 4.2 cm, previously, 4.4 cm.  Recommendation for annual imaging.  Will update CTA aorta.  Hypothyroid: followed by Dr. Harlow Asa, endocrinology.   Tobacco abuse: 1 PPD. Discussed detrimental effects of smoking on the circulation, Hypertension and heart. She is currently not ready to quit but will think about it.    Medication Adjustments/Labs and Tests Ordered: Current medicines are reviewed at length with the patient today.  Concerns regarding medicines are outlined above. Labs and tests ordered and medication changes are outlined in the patient instructions below:  Patient Instructions  Medication Instructions:  1. START LISINOPRIL 10 MG DAILY; RX HAS BEEN SENT IN  Labwork: TODAY BMET  Testing/Procedures: PLEASE SCHEDULE A CHEST CT-A ; THIS WILL BE A FOLLOW UP ON THE 2018 CT  Follow-Up: Your physician wants you to follow-up in: [place] DR. ROSS . You will receive a reminder letter in the mail two months in advance. If you don't receive a letter, please call our office to schedule the follow-up appointment.   Any Other Special Instructions Will Be Listed Below (If Applicable).     If you need a refill on your cardiac medications before your next appointment, please call your pharmacy.          DASH Eating Plan DASH stands for "Dietary Approaches to Stop  Hypertension." The DASH eating plan is a healthy eating plan that has been shown to reduce high blood pressure (hypertension). It may also reduce your risk for type 2 diabetes, heart disease, and stroke. The DASH eating plan may also help with weight loss. What are tips for following this plan? General guidelines  Avoid eating more than 2,300 mg (milligrams) of salt (sodium) a day. If you have hypertension, you may need to reduce your sodium intake to 1,500 mg a day.  Limit alcohol intake to no more than 1 drink a day for nonpregnant women and 2 drinks a day for men. One drink equals 12 oz of beer, 5 oz of wine, or 1 oz of hard liquor.  Work with your health care provider to maintain a healthy body weight or to lose weight. Ask what an ideal weight is for you.  Get at least 30 minutes of exercise that causes your heart to beat faster (aerobic exercise) most days of the week. Activities may include walking, swimming, or biking.  Work with your health care provider or diet and nutrition specialist (dietitian) to adjust your eating plan to your individual calorie needs. Reading food labels  Check food labels for the amount of sodium per serving. Choose foods with less than 5 percent of the Daily Value of sodium. Generally, foods with less than 300 mg of sodium per serving fit into this eating plan.  To find whole grains, look for the word "whole" as the first word in the ingredient list. Shopping  Buy products labeled as "low-sodium" or "no salt added."  Buy fresh foods. Avoid canned foods and premade or frozen meals. Cooking  Avoid adding salt when cooking. Use salt-free seasonings or herbs instead of table salt or sea salt. Check with your health care provider or pharmacist before using salt substitutes.  Do not fry foods. Cook foods using healthy methods such as baking, boiling, grilling, and broiling instead.  Cook with heart-healthy oils, such as olive, canola, soybean, or sunflower  oil. Meal planning   Eat a balanced diet that includes: ? 5 or more servings of fruits  and vegetables each day. At each meal, try to fill half of your plate with fruits and vegetables. ? Up to 6-8 servings of whole grains each day. ? Less than 6 oz of lean meat, poultry, or fish each day. A 3-oz serving of meat is about the same size as a deck of cards. One egg equals 1 oz. ? 2 servings of low-fat dairy each day. ? A serving of nuts, seeds, or beans 5 times each week. ? Heart-healthy fats. Healthy fats called Omega-3 fatty acids are found in foods such as flaxseeds and coldwater fish, like sardines, salmon, and mackerel.  Limit how much you eat of the following: ? Canned or prepackaged foods. ? Food that is high in trans fat, such as fried foods. ? Food that is high in saturated fat, such as fatty meat. ? Sweets, desserts, sugary drinks, and other foods with added sugar. ? Full-fat dairy products.  Do not salt foods before eating.  Try to eat at least 2 vegetarian meals each week.  Eat more home-cooked food and less restaurant, buffet, and fast food.  When eating at a restaurant, ask that your food be prepared with less salt or no salt, if possible. What foods are recommended? The items listed may not be a complete list. Talk with your dietitian about what dietary choices are best for you. Grains Whole-grain or whole-wheat bread. Whole-grain or whole-wheat pasta. Brown rice. [place]. Bulgur. Whole-grain and low-sodium cereals. Pita bread. Low-fat, low-sodium crackers. Whole-wheat flour tortillas. Vegetables Fresh or frozen vegetables (raw, steamed, roasted, or grilled). Low-sodium or reduced-sodium tomato and vegetable juice. Low-sodium or reduced-sodium tomato sauce and tomato paste. Low-sodium or reduced-sodium canned vegetables. Fruits All fresh, dried, or frozen fruit. Canned fruit in natural juice (without added sugar). Meat and other protein foods Skinless chicken or  [place]. Ground chicken or [place]. Pork with fat trimmed off. Fish and seafood. Egg whites. Dried beans, peas, or lentils. Unsalted nuts, nut butters, and seeds. Unsalted canned beans. Lean cuts of beef with fat trimmed off. Low-sodium, lean deli meat. Dairy Low-fat (1%) or fat-free (skim) milk. Fat-free, low-fat, or reduced-fat cheeses. Nonfat, low-sodium ricotta or cottage cheese. Low-fat or nonfat yogurt. Low-fat, low-sodium cheese. Fats and oils Soft margarine without trans fats. Vegetable oil. Low-fat, reduced-fat, or light mayonnaise and salad dressings (reduced-sodium). Canola, safflower, olive, soybean, and sunflower oils. Avocado. Seasoning and other foods Herbs. Spices. Seasoning mixes without salt. Unsalted popcorn and pretzels. Fat-free sweets. What foods are not recommended? The items listed may not be a complete list. Talk with your dietitian about what dietary choices are best for you. Grains Baked goods made with fat, such as croissants, muffins, or some breads. Dry pasta or rice meal packs. Vegetables Creamed or fried vegetables. Vegetables in a cheese sauce. Regular canned vegetables (not low-sodium or reduced-sodium). Regular canned tomato sauce and paste (not low-sodium or reduced-sodium). Regular tomato and vegetable juice (not low-sodium or reduced-sodium). Angie Fava. Olives. Fruits Canned fruit in a light or heavy syrup. Fried fruit. Fruit in cream or butter sauce. Meat and other protein foods Fatty cuts of meat. Ribs. Fried meat. Berniece Salines. Sausage. Bologna and other processed lunch meats. Salami. Fatback. Hotdogs. Bratwurst. Salted nuts and seeds. Canned beans with added salt. Canned or smoked fish. Whole eggs or egg yolks. Chicken or [place] with skin. Dairy Whole or 2% milk, cream, and half-and-half. Whole or full-fat cream cheese. Whole-fat or sweetened yogurt. Full-fat cheese. Nondairy creamers. Whipped toppings. Processed cheese and cheese spreads. Fats and oils  Butter.  Stick margarine. Lard. Shortening. Ghee. Bacon fat. Tropical oils, such as coconut, palm kernel, or palm oil. Seasoning and other foods Salted popcorn and pretzels. Onion salt, garlic salt, seasoned salt, table salt, and sea salt. Worcestershire sauce. Tartar sauce. Barbecue sauce. Teriyaki sauce. Soy sauce, including reduced-sodium. Steak sauce. Canned and packaged gravies. Fish sauce. Oyster sauce. Cocktail sauce. Horseradish that you find on the shelf. Ketchup. Mustard. Meat flavorings and tenderizers. Bouillon cubes. Hot sauce and Tabasco sauce. Premade or packaged marinades. Premade or packaged taco seasonings. Relishes. Regular salad dressings. Where to find more information:  National Heart, Lung, and [place]: https://wilson-eaton.com/  American Heart Association: www.heart.org Summary  The DASH eating plan is a healthy eating plan that has been shown to reduce high blood pressure (hypertension). It may also reduce your risk for type 2 diabetes, heart disease, and stroke.  With the DASH eating plan, you should limit salt (sodium) intake to 2,300 mg a day. If you have hypertension, you may need to reduce your sodium intake to 1,500 mg a day.  When on the DASH eating plan, aim to eat more fresh fruits and vegetables, whole grains, lean proteins, low-fat dairy, and heart-healthy fats.  Work with your health care provider or diet and nutrition specialist (dietitian) to adjust your eating plan to your individual calorie needs. This information is not intended to replace advice given to you by your health care provider. Make sure you discuss any questions you have with your health care provider. Document Released: 02/16/2011 Document Revised: 02/21/2016 Document Reviewed: 02/21/2016 Elsevier Interactive Patient Education  2018 [employer].  Smoking Tobacco Information Smoking tobacco will very likely harm your health. Tobacco contains a poisonous (toxic), colorless chemical called nicotine.  Nicotine affects the brain and makes tobacco addictive. This change in your brain can make it hard to stop smoking. Tobacco also has other toxic chemicals that can hurt your body and raise your risk of many cancers. How can smoking tobacco affect me? Smoking tobacco can increase your chances of having serious health conditions, such as:  Cancer. Smoking is most commonly associated with lung cancer, but can lead to cancer in other parts of the body.  Chronic obstructive pulmonary disease (COPD). This is a long-term lung condition that makes it hard to breathe. It also gets worse over time.  High blood pressure (hypertension), heart disease, stroke, or heart attack.  Lung infections, such as pneumonia.  Cataracts. This is when the lenses in the eyes become clouded.  Digestive problems. This may include peptic ulcers, heartburn, and gastroesophageal reflux disease (GERD).  Oral health problems, such as gum disease and tooth loss.  Loss of taste and smell.  Smoking can affect your appearance by causing:  Wrinkles.  Yellow or stained teeth, fingers, and fingernails.  Smoking tobacco can also affect your social life.  Many workplaces, [employer], hotels, and public places are tobacco-free. This means that you may experience challenges in finding places to smoke when away from home.  The cost of a smoking habit can be expensive. Expenses for someone who smokes come in two ways: ? You spend money on a regular basis to buy tobacco. ? Your health care costs in the long-term are higher if you smoke.  Tobacco smoke can also affect the health of those around you. Children of smokers have greater chances of: ? Sudden infant death syndrome (SIDS). ? Ear infections. ? Lung infections.  What lifestyle changes can be made?  Do not start smoking. Quit if  you already do.  To quit smoking: ? Make a plan to quit smoking and commit yourself to it. Look for programs to help you and ask your  health care provider for recommendations and ideas. ? Talk with your health care provider about using nicotine replacement medicines to help you quit. Medicine replacement medicines include gum, lozenges, patches, sprays, or pills. ? Do not replace cigarette smoking with electronic cigarettes, which are commonly called e-cigarettes. The safety of e-cigarettes is not known, and some may contain harmful chemicals. ? Avoid places, people, or situations that tempt you to smoke. ? If you try to quit but return to smoking, don't give up hope. It is very common for people to try a number of times before they fully succeed. When you feel ready again, give it another try.  Quitting smoking might affect the way you eat as well as your weight. Be prepared to monitor your eating habits. Get support in planning and following a healthy diet.  Ask your health care provider about having regular tests (screenings) to check for cancer. This may include blood tests, imaging tests, and other tests.  Exercise regularly. Consider taking walks, joining a gym, or doing yoga or exercise classes.  Develop skills to manage your stress. These skills include meditation. What are the benefits of quitting smoking? By quitting smoking, you may:  Lower your risk of getting cancer and other diseases caused by smoking.  Live longer.  Breathe better.  Lower your blood pressure and heart rate.  Stop your addiction to tobacco.  Stop creating secondhand smoke that hurts other people.  Improve your sense of taste and smell.  Look better over time, due to having fewer wrinkles and less staining.  What can happen if changes are not made? If you do not stop smoking, you may:  Get cancer and other diseases.  Develop COPD or other long-term (chronic) lung conditions.  Develop serious problems with your heart and blood vessels (cardiovascular system).  Need more tests to screen for problems caused by smoking.  Have  higher, long-term healthcare costs from medicines or treatments related to smoking.  Continue to have worsening changes in your lungs, mouth, and nose.  Where to find support: To get support to quit smoking, consider:  Asking your health care provider for more information and resources.  Taking classes to learn more about quitting smoking.  Looking for local organizations that offer resources about quitting smoking.  Joining a support group for people who want to quit smoking in your local community.  Where to find more information: You may find more information about quitting smoking from:  HelpGuide.org: www.helpguide.org/articles/addictions/how-to-quit-smoking.htm  https://hall.com/: smokefree.gov  American Lung Association: www.lung.org  Contact a health care provider if:  You have problems breathing.  Your lips, nose, or fingers turn blue.  You have chest pain.  You are coughing up blood.  You feel faint or you pass out.  You have other noticeable changes that cause you to worry. Summary  Smoking tobacco can negatively affect your health, the health of those around you, your finances, and your social life.  Do not start smoking. Quit if you already do. If you need help quitting, ask your health care provider.  Think about joining a support group for people who want to quit smoking in your local community. There are many effective programs that will help you to quit this behavior. This information is not intended to replace advice given to you by your health care provider. Make  sure you discuss any questions you have with your health care provider. Document Released: 03/14/2016 Document Revised: 03/14/2016 Document Reviewed: 03/14/2016 Elsevier Interactive Patient Education  2018 [place], Daune Perch, NP  06/18/2017 9:14 AM    [place]

## 2017-06-18 ENCOUNTER — Encounter (INDEPENDENT_AMBULATORY_CARE_PROVIDER_SITE_OTHER): Payer: Self-pay

## 2017-06-18 ENCOUNTER — Ambulatory Visit: Payer: 59 | Admitting: Cardiology

## 2017-06-18 DIAGNOSIS — I712 Thoracic aortic aneurysm, without rupture, unspecified: Secondary | ICD-10-CM | POA: Insufficient documentation

## 2017-06-18 DIAGNOSIS — Z720 Tobacco use: Secondary | ICD-10-CM | POA: Diagnosis not present

## 2017-06-18 DIAGNOSIS — I10 Essential (primary) hypertension: Secondary | ICD-10-CM | POA: Diagnosis not present

## 2017-06-18 DIAGNOSIS — E785 Hyperlipidemia, unspecified: Secondary | ICD-10-CM

## 2017-06-18 LAB — BASIC METABOLIC PANEL
BUN / CREAT RATIO: 21 (ref 9–23)
BUN: 15 mg/dL (ref 6–24)
CO2: 25 mmol/L (ref 20–29)
Calcium: 10 mg/dL (ref 8.7–10.2)
Chloride: 101 mmol/L (ref 96–106)
Creatinine, Ser: 0.73 mg/dL (ref 0.57–1.00)
GFR, EST AFRICAN AMERICAN: 115 mL/min/{1.73_m2} (ref >59)
GFR, EST NON AFRICAN AMERICAN: 100 mL/min/{1.73_m2} (ref >59)
Glucose: 112 mg/dL — ABNORMAL HIGH (ref 65–99)
POTASSIUM: 4.7 mmol/L (ref 3.5–5.2)
SODIUM: 140 mmol/L (ref 134–144)

## 2017-06-18 MED ORDER — LISINOPRIL 10 MG PO TABS: 10.0000 mg | ORAL_TABLET | Freq: Every day | ORAL | 3 refills | Status: DC

## 2017-06-18 NOTE — Patient Instructions (Addendum)
Medication Instructions:  1. START LISINOPRIL 10 MG DAILY; RX HAS BEEN SENT IN  Labwork: TODAY BMET  Testing/Procedures: PLEASE SCHEDULE A CHEST CT-A ; THIS WILL BE A FOLLOW UP ON THE 2018 CT  Follow-Up: Your physician wants you to follow-up in: [place] DR. ROSS . You will receive a reminder letter in the mail two months in advance. If you don't receive a letter, please call our office to schedule the follow-up appointment.   Any Other Special Instructions Will Be Listed Below (If Applicable).     If you need a refill on your cardiac medications before your next appointment, please call your pharmacy.          DASH Eating Plan DASH stands for "Dietary Approaches to Stop Hypertension." The DASH eating plan is a healthy eating plan that has been shown to reduce high blood pressure (hypertension). It may also reduce your risk for type 2 diabetes, heart disease, and stroke. The DASH eating plan may also help with weight loss. What are tips for following this plan? General guidelines  Avoid eating more than 2,300 mg (milligrams) of salt (sodium) a day. If you have hypertension, you may need to reduce your sodium intake to 1,500 mg a day.  Limit alcohol intake to no more than 1 drink a day for nonpregnant women and 2 drinks a day for men. One drink equals 12 oz of beer, 5 oz of wine, or 1 oz of hard liquor.  Work with your health care provider to maintain a healthy body weight or to lose weight. Ask what an ideal weight is for you.  Get at least 30 minutes of exercise that causes your heart to beat faster (aerobic exercise) most days of the week. Activities may include walking, swimming, or biking.  Work with your health care provider or diet and nutrition specialist (dietitian) to adjust your eating plan to your individual calorie needs. Reading food labels  Check food labels for the amount of sodium per serving. Choose foods with less than 5 percent of the Daily Value  of sodium. Generally, foods with less than 300 mg of sodium per serving fit into this eating plan.  To find whole grains, look for the word "whole" as the first word in the ingredient list. Shopping  Buy products labeled as "low-sodium" or "no salt added."  Buy fresh foods. Avoid canned foods and premade or frozen meals. Cooking  Avoid adding salt when cooking. Use salt-free seasonings or herbs instead of table salt or sea salt. Check with your health care provider or pharmacist before using salt substitutes.  Do not fry foods. Cook foods using healthy methods such as baking, boiling, grilling, and broiling instead.  Cook with heart-healthy oils, such as olive, canola, soybean, or sunflower oil. Meal planning   Eat a balanced diet that includes: ? 5 or more servings of fruits and vegetables each day. At each meal, try to fill half of your plate with fruits and vegetables. ? Up to 6-8 servings of whole grains each day. ? Less than 6 oz of lean meat, poultry, or fish each day. A 3-oz serving of meat is about the same size as a deck of cards. One egg equals 1 oz. ? 2 servings of low-fat dairy each day. ? A serving of nuts, seeds, or beans 5 times each week. ? Heart-healthy fats. Healthy fats called Omega-3 fatty acids are found in foods such as flaxseeds and coldwater fish, like sardines, salmon, and mackerel.  Limit how much you eat of the following: ? Canned or prepackaged foods. ? Food that is high in trans fat, such as fried foods. ? Food that is high in saturated fat, such as fatty meat. ? Sweets, desserts, sugary drinks, and other foods with added sugar. ? Full-fat dairy products.  Do not salt foods before eating.  Try to eat at least 2 vegetarian meals each week.  Eat more home-cooked food and less restaurant, buffet, and fast food.  When eating at a restaurant, ask that your food be prepared with less salt or no salt, if possible. What foods are recommended? The items  listed may not be a complete list. Talk with your dietitian about what dietary choices are best for you. Grains Whole-grain or whole-wheat bread. Whole-grain or whole-wheat pasta. Brown rice. [place]. Bulgur. Whole-grain and low-sodium cereals. Pita bread. Low-fat, low-sodium crackers. Whole-wheat flour tortillas. Vegetables Fresh or frozen vegetables (raw, steamed, roasted, or grilled). Low-sodium or reduced-sodium tomato and vegetable juice. Low-sodium or reduced-sodium tomato sauce and tomato paste. Low-sodium or reduced-sodium canned vegetables. Fruits All fresh, dried, or frozen fruit. Canned fruit in natural juice (without added sugar). Meat and other protein foods Skinless chicken or [place]. Ground chicken or [place]. Pork with fat trimmed off. Fish and seafood. Egg whites. Dried beans, peas, or lentils. Unsalted nuts, nut butters, and seeds. Unsalted canned beans. Lean cuts of beef with fat trimmed off. Low-sodium, lean deli meat. Dairy Low-fat (1%) or fat-free (skim) milk. Fat-free, low-fat, or reduced-fat cheeses. Nonfat, low-sodium ricotta or cottage cheese. Low-fat or nonfat yogurt. Low-fat, low-sodium cheese. Fats and oils Soft margarine without trans fats. Vegetable oil. Low-fat, reduced-fat, or light mayonnaise and salad dressings (reduced-sodium). Canola, safflower, olive, soybean, and sunflower oils. Avocado. Seasoning and other foods Herbs. Spices. Seasoning mixes without salt. Unsalted popcorn and pretzels. Fat-free sweets. What foods are not recommended? The items listed may not be a complete list. Talk with your dietitian about what dietary choices are best for you. Grains Baked goods made with fat, such as croissants, muffins, or some breads. Dry pasta or rice meal packs. Vegetables Creamed or fried vegetables. Vegetables in a cheese sauce. Regular canned vegetables (not low-sodium or reduced-sodium). Regular canned tomato sauce and paste (not low-sodium or  reduced-sodium). Regular tomato and vegetable juice (not low-sodium or reduced-sodium). Angie Fava. Olives. Fruits Canned fruit in a light or heavy syrup. Fried fruit. Fruit in cream or butter sauce. Meat and other protein foods Fatty cuts of meat. Ribs. Fried meat. Berniece Salines. Sausage. Bologna and other processed lunch meats. Salami. Fatback. Hotdogs. Bratwurst. Salted nuts and seeds. Canned beans with added salt. Canned or smoked fish. Whole eggs or egg yolks. Chicken or [place] with skin. Dairy Whole or 2% milk, cream, and half-and-half. Whole or full-fat cream cheese. Whole-fat or sweetened yogurt. Full-fat cheese. Nondairy creamers. Whipped toppings. Processed cheese and cheese spreads. Fats and oils Butter. Stick margarine. Lard. Shortening. Ghee. Bacon fat. Tropical oils, such as coconut, palm kernel, or palm oil. Seasoning and other foods Salted popcorn and pretzels. Onion salt, garlic salt, seasoned salt, table salt, and sea salt. Worcestershire sauce. Tartar sauce. Barbecue sauce. Teriyaki sauce. Soy sauce, including reduced-sodium. Steak sauce. Canned and packaged gravies. Fish sauce. Oyster sauce. Cocktail sauce. Horseradish that you find on the shelf. Ketchup. Mustard. Meat flavorings and tenderizers. Bouillon cubes. Hot sauce and Tabasco sauce. Premade or packaged marinades. Premade or packaged taco seasonings. Relishes. Regular salad dressings. Where to find more information:  National Heart, Lung, and Blood  Institute: https://wilson-eaton.com/  American Heart Association: www.heart.org Summary  The DASH eating plan is a healthy eating plan that has been shown to reduce high blood pressure (hypertension). It may also reduce your risk for type 2 diabetes, heart disease, and stroke.  With the DASH eating plan, you should limit salt (sodium) intake to 2,300 mg a day. If you have hypertension, you may need to reduce your sodium intake to 1,500 mg a day.  When on the DASH eating plan, aim to eat more  fresh fruits and vegetables, whole grains, lean proteins, low-fat dairy, and heart-healthy fats.  Work with your health care provider or diet and nutrition specialist (dietitian) to adjust your eating plan to your individual calorie needs. This information is not intended to replace advice given to you by your health care provider. Make sure you discuss any questions you have with your health care provider. Document Released: 02/16/2011 Document Revised: 02/21/2016 Document Reviewed: 02/21/2016 Elsevier Interactive Patient Education  2018 [employer].  Smoking Tobacco Information Smoking tobacco will very likely harm your health. Tobacco contains a poisonous (toxic), colorless chemical called nicotine. Nicotine affects the brain and makes tobacco addictive. This change in your brain can make it hard to stop smoking. Tobacco also has other toxic chemicals that can hurt your body and raise your risk of many cancers. How can smoking tobacco affect me? Smoking tobacco can increase your chances of having serious health conditions, such as:  Cancer. Smoking is most commonly associated with lung cancer, but can lead to cancer in other parts of the body.  Chronic obstructive pulmonary disease (COPD). This is a long-term lung condition that makes it hard to breathe. It also gets worse over time.  High blood pressure (hypertension), heart disease, stroke, or heart attack.  Lung infections, such as pneumonia.  Cataracts. This is when the lenses in the eyes become clouded.  Digestive problems. This may include peptic ulcers, heartburn, and gastroesophageal reflux disease (GERD).  Oral health problems, such as gum disease and tooth loss.  Loss of taste and smell.  Smoking can affect your appearance by causing:  Wrinkles.  Yellow or stained teeth, fingers, and fingernails.  Smoking tobacco can also affect your social life.  Many workplaces, [employer], hotels, and public places are  tobacco-free. This means that you may experience challenges in finding places to smoke when away from home.  The cost of a smoking habit can be expensive. Expenses for someone who smokes come in two ways: ? You spend money on a regular basis to buy tobacco. ? Your health care costs in the long-term are higher if you smoke.  Tobacco smoke can also affect the health of those around you. Children of smokers have greater chances of: ? Sudden infant death syndrome (SIDS). ? Ear infections. ? Lung infections.  What lifestyle changes can be made?  Do not start smoking. Quit if you already do.  To quit smoking: ? Make a plan to quit smoking and commit yourself to it. Look for programs to help you and ask your health care provider for recommendations and ideas. ? Talk with your health care provider about using nicotine replacement medicines to help you quit. Medicine replacement medicines include gum, lozenges, patches, sprays, or pills. ? Do not replace cigarette smoking with electronic cigarettes, which are commonly called e-cigarettes. The safety of e-cigarettes is not known, and some may contain harmful chemicals. ? Avoid places, people, or situations that tempt you to smoke. ? If you try to quit  but return to smoking, don't give up hope. It is very common for people to try a number of times before they fully succeed. When you feel ready again, give it another try.  Quitting smoking might affect the way you eat as well as your weight. Be prepared to monitor your eating habits. Get support in planning and following a healthy diet.  Ask your health care provider about having regular tests (screenings) to check for cancer. This may include blood tests, imaging tests, and other tests.  Exercise regularly. Consider taking walks, joining a gym, or doing yoga or exercise classes.  Develop skills to manage your stress. These skills include meditation. What are the benefits of quitting smoking? By  quitting smoking, you may:  Lower your risk of getting cancer and other diseases caused by smoking.  Live longer.  Breathe better.  Lower your blood pressure and heart rate.  Stop your addiction to tobacco.  Stop creating secondhand smoke that hurts other people.  Improve your sense of taste and smell.  Look better over time, due to having fewer wrinkles and less staining.  What can happen if changes are not made? If you do not stop smoking, you may:  Get cancer and other diseases.  Develop COPD or other long-term (chronic) lung conditions.  Develop serious problems with your heart and blood vessels (cardiovascular system).  Need more tests to screen for problems caused by smoking.  Have higher, long-term healthcare costs from medicines or treatments related to smoking.  Continue to have worsening changes in your lungs, mouth, and nose.  Where to find support: To get support to quit smoking, consider:  Asking your health care provider for more information and resources.  Taking classes to learn more about quitting smoking.  Looking for local organizations that offer resources about quitting smoking.  Joining a support group for people who want to quit smoking in your local community.  Where to find more information: You may find more information about quitting smoking from:  HelpGuide.org: www.helpguide.org/articles/addictions/how-to-quit-smoking.htm  https://hall.com/: smokefree.gov  American Lung Association: www.lung.org  Contact a health care provider if:  You have problems breathing.  Your lips, nose, or fingers turn blue.  You have chest pain.  You are coughing up blood.  You feel faint or you pass out.  You have other noticeable changes that cause you to worry. Summary  Smoking tobacco can negatively affect your health, the health of those around you, your finances, and your social life.  Do not start smoking. Quit if you already do. If you  need help quitting, ask your health care provider.  Think about joining a support group for people who want to quit smoking in your local community. There are many effective programs that will help you to quit this behavior. This information is not intended to replace advice given to you by your health care provider. Make sure you discuss any questions you have with your health care provider. Document Released: 03/14/2016 Document Revised: 03/14/2016 Document Reviewed: 03/14/2016 Elsevier Interactive Patient Education  [employer].

## 2017-07-10 ENCOUNTER — Ambulatory Visit (INDEPENDENT_AMBULATORY_CARE_PROVIDER_SITE_OTHER)
Admission: RE | Admit: 2017-07-10 | Discharge: 2017-07-10 | Disposition: A | Discharge status: Home / Self Care | Payer: 59 | Source: Ambulatory Visit | Attending: Cardiology | Admitting: Cardiology

## 2017-07-10 DIAGNOSIS — I712 Thoracic aortic aneurysm, without rupture, unspecified: Secondary | ICD-10-CM

## 2017-07-10 DIAGNOSIS — I10 Essential (primary) hypertension: Secondary | ICD-10-CM | POA: Diagnosis not present

## 2017-07-10 MED ORDER — IOPAMIDOL (ISOVUE-370) INJECTION 76%
100.0000 mL | Freq: Once | INTRAVENOUS | Status: AC | PRN
  Administered 2017-07-10: 100 mL via INTRAVENOUS

## 2017-07-11 ENCOUNTER — Telehealth: Payer: Self-pay | Admitting: Internal Medicine

## 2017-07-11 NOTE — Telephone Encounter (Signed)
I spoke with pt and reviewed CT results with her.

## 2017-07-11 NOTE — Telephone Encounter (Signed)
New message ° °Pt verbalized that she is returning call for RN °

## 2017-08-08 ENCOUNTER — Other Ambulatory Visit: Payer: 59

## 2017-08-10 ENCOUNTER — Other Ambulatory Visit: Payer: 59

## 2017-08-10 ENCOUNTER — Other Ambulatory Visit: Payer: 59 | Admitting: *Deleted

## 2017-08-10 DIAGNOSIS — E785 Hyperlipidemia, unspecified: Secondary | ICD-10-CM | POA: Diagnosis not present

## 2017-08-10 LAB — LIPID PANEL
CHOL/HDL RATIO: 3.1 ratio (ref 0.0–4.4)
Cholesterol, Total: 141 mg/dL (ref 100–199)
HDL: 46 mg/dL (ref >39)
LDL CALC: 76 mg/dL (ref 0–99)
Triglycerides: 94 mg/dL (ref 0–149)
VLDL CHOLESTEROL CAL: 19 mg/dL (ref 5–40)

## 2017-10-17 DIAGNOSIS — N3091 Cystitis, unspecified with hematuria: Secondary | ICD-10-CM | POA: Diagnosis not present

## 2017-10-17 DIAGNOSIS — N3000 Acute cystitis without hematuria: Secondary | ICD-10-CM | POA: Diagnosis not present

## 2017-12-07 ENCOUNTER — Ambulatory Visit: Payer: 59 | Admitting: Internal Medicine

## 2017-12-07 ENCOUNTER — Encounter (INDEPENDENT_AMBULATORY_CARE_PROVIDER_SITE_OTHER): Payer: Self-pay

## 2017-12-07 ENCOUNTER — Encounter: Payer: Self-pay | Admitting: Internal Medicine

## 2017-12-07 VITALS — BP 124/72 | HR 88 | Ht 61.0 in | Wt 167.8 lb

## 2017-12-07 DIAGNOSIS — I10 Essential (primary) hypertension: Secondary | ICD-10-CM

## 2017-12-07 DIAGNOSIS — E782 Mixed hyperlipidemia: Secondary | ICD-10-CM | POA: Diagnosis not present

## 2017-12-07 DIAGNOSIS — I712 Thoracic aortic aneurysm, without rupture, unspecified: Secondary | ICD-10-CM

## 2017-12-07 NOTE — Progress Notes (Signed)
Cardiology Office Note   Date:  12/07/2017   ID:  Erin Kaufman, DOB 10/14/1970, MRN [id number]  PCP:  Arvella Nigh, MD  Cardiologist:   Dorris Carnes, MD   Pt presents for f/u of cardiac risks and also thoracic aneurysm      History of Present Illness: Erin Kaufman is a 47 y.o. female with remote history of tobacco, hx of hyperlipidemia, FHx of CAD   I saw her in 2018    IN 2017 she had a cardiac Calcium score of 9   The ascending aorta was measured at 44 mm  I saw the patient last fall  She was seen in spring by Buren Kos   BP meds adjusted  The pt is feeling good   WORking out now 4x per week  No SOB   No CP   No dizziness BP at home 120s/70s   (was /90s)     Outpatient Medications Prior to Visit  Medication Sig Dispense Refill  . aspirin EC 81 MG tablet Take 1 tablet (81 mg total) by mouth daily. 90 tablet 3  . cholecalciferol (VITAMIN D) 1000 units tablet Take 1,000 Units by mouth daily.    Marland Kitchen lisinopril (PRINIVIL,ZESTRIL) 10 MG tablet Take 1 tablet (10 mg total) by mouth daily. 90 tablet 3  . NEXIUM 40 MG capsule TAKE 1 CAPSULE (40 MG TOTAL) BY MOUTH DAILY. 30 capsule 6  . rosuvastatin (CRESTOR) 20 MG tablet Take 1 tablet (20 mg total) by mouth daily. 90 tablet 3  . SYNTHROID 125 MCG tablet Take 1 tablet (125 mcg total) by mouth daily. 30 tablet 11   No facility-administered medications prior to visit.      Allergies:   Penicillins   Past Medical History:  Diagnosis Date  . Anxiety   . GERD (gastroesophageal reflux disease)   . Hiatal hernia   . Hypertension   . Thyroid disease     Past Surgical History:  Procedure Laterality Date  . ABDOMINAL HYSTERECTOMY  2007     Social History:  The patient  reports that she has been smoking cigarettes. She has never used smokeless tobacco. She reports that she drinks alcohol. She reports that she does not use drugs.   Family History:  The patient's family history includes Brain cancer in her mother; Diabetes in  her mother; Heart disease in her mother; Hypertension in her mother; Lymphoma in her mother; Prostate cancer in her father.    ROS:  Please see the history of present illness. All other systems are reviewed and  Negative to the above problem except as noted.    PHYSICAL EXAM: VS:  BP 124/72   Pulse 88   Ht 5\' 1"  (1.549 m)   Wt 167 lb 12.8 oz (76.1 kg)   BMI 31.71 kg/m   GEN: Well nourished, well developed, in no acute distress  HEENT: normal  Neck: JVP normal    NO, carotid bruits, or masses Cardiac: RRR; no murmurs, rubs, or gallops,no edema  Respiratory:  clear to auscultation bilaterally, normal work of breathing  Rhonchi   GI: soft, nontender, nondistended, + BS  No hepatomegaly  MS: no deformity Moving all extremities   Skin: warm and dry, no rash Neuro:  Strength and sensation are intact Psych: euthymic mood, full affect  EKG is not done    Lipid Panel    Component Value Date/Time   CHOL 141 08/10/2017 0916   TRIG 94 08/10/2017 0916   HDL 46  08/10/2017 0916   CHOLHDL 3.1 08/10/2017 0916   CHOLHDL 6 08/22/2013 1047   VLDL 24.2 08/22/2013 1047   LDLCALC 76 08/10/2017 0916      Wt Readings from Last 3 Encounters:  12/07/17 167 lb 12.8 oz (76.1 kg)  06/18/17 177 lb (80.3 kg)  12/04/16 171 lb 1.9 oz (77.6 kg)      ASSESSMENT AND PLAN: 1  Thoracic aneurysm   Last CT aorta was stable, actually measures 107mm   Will need periodic f/u    2  HTN   BP is good   Keep on same mesd   3HL  Continiue statin   PT working out 4x per week  [employer]  Encouraged her to keep that up      Signed, Dorris Carnes, MD  12/07/2017 9:52 AM    [address], [place], [place]  [address] Phone: [phone]; Fax: [phone]

## 2017-12-07 NOTE — Patient Instructions (Signed)
Medication Instructions: Your physician recommends that you continue on your current medications as directed. Please refer to the Current Medication list given to you today.   Labwork: none ordered   Testing/Procedures: None ordered   Follow-Up: Your physician wants you to follow-up in: one year with Dr. Harrington Challenger... You will receive a reminder letter in the mail two months in advance. If you don't receive a letter, please call our office to schedule the follow-up appointment.   Any Other Special Instructions Will Be Listed Below (If Applicable).     If you need a refill on your cardiac medications before your next appointment, please call your pharmacy.

## 2018-06-10 ENCOUNTER — Other Ambulatory Visit: Payer: Self-pay | Admitting: Internal Medicine

## 2018-09-28 ENCOUNTER — Other Ambulatory Visit: Payer: Self-pay | Admitting: Internal Medicine

## 2018-12-24 ENCOUNTER — Other Ambulatory Visit: Payer: Self-pay | Admitting: Internal Medicine

## 2019-01-16 ENCOUNTER — Telehealth: Payer: Self-pay | Admitting: *Deleted

## 2019-01-16 NOTE — Telephone Encounter (Signed)
I placed call to Erin Kaufman to let her know appointment on 11/9 with Dr Harrington Challenger was a virtual appointment. I left a message to call office.  Need telemedicine consent.

## 2019-01-20 ENCOUNTER — Telehealth (INDEPENDENT_AMBULATORY_CARE_PROVIDER_SITE_OTHER): Payer: 59 | Admitting: Internal Medicine

## 2019-01-20 ENCOUNTER — Encounter: Payer: Self-pay | Admitting: Internal Medicine

## 2019-01-20 ENCOUNTER — Other Ambulatory Visit: Payer: Self-pay

## 2019-01-20 DIAGNOSIS — I10 Essential (primary) hypertension: Secondary | ICD-10-CM

## 2019-01-20 DIAGNOSIS — I712 Thoracic aortic aneurysm, without rupture: Secondary | ICD-10-CM | POA: Diagnosis not present

## 2019-01-20 MED ORDER — LISINOPRIL 10 MG PO TABS: 10.0000 mg | ORAL_TABLET | Freq: Every day | ORAL | 3 refills | Status: DC

## 2019-01-20 NOTE — Patient Instructions (Signed)
Medication Instructions:  Your physician has recommended you make the following change in your medication: A prescription for Lisinopril 10 mg daily has been sent to your pharmacy.  Start with taking half tablet by mouth daily and check BP daily at home.  Call our office with these readings in a few weeks.   *If you need a refill on your cardiac medications before your next appointment, please call your pharmacy*  Lab Work: none If you have labs (blood work) drawn today and your tests are completely normal, you will receive your results only by: Marland Kitchen MyChart Message (if you have MyChart) OR . A paper copy in the mail If you have any lab test that is abnormal or we need to change your treatment, we will call you to review the results.  Testing/Procedures: none  Follow-Up: At [facility], you and your health needs are our priority.  As part of our continuing mission to provide you with exceptional heart care, we have created designated Provider Care Teams.  These Care Teams include your primary Cardiologist (physician) and Advanced Practice Providers (APPs -  Physician Assistants and Nurse Practitioners) who all work together to provide you with the care you need, when you need it.  Your next appointment:   12 months  The format for your next appointment:   In Person  Provider:   You may see Dorris Carnes, MD or one of the following Advanced Practice Providers on your designated Care Team:    Richardson Dopp, PA-C  [place], [place]  Daune Perch, NP   Other Instructions Check blood pressure daily at home (about 2 hours after taking lisinopril). Keep record of readings and call our office to report readings in a few weeks.

## 2019-01-20 NOTE — Telephone Encounter (Signed)
Per DPR is it OK to leave message on cell phone voicemail.   I left message that today's appointment was virtual.  I asked pt to call office to discuss.

## 2019-01-20 NOTE — Progress Notes (Signed)
Virtual Visit via Telephone Note   This visit type was conducted due to national recommendations for restrictions regarding the COVID-19 Pandemic (e.g. social distancing) in an effort to limit this patient's exposure and mitigate transmission in our community.  Due to her co-morbid illnesses, this patient is at least at moderate risk for complications without adequate follow up.  This format is felt to be most appropriate for this patient at this time.  The patient did not have access to video technology/had technical difficulties with video requiring transitioning to audio format only (telephone).  All issues noted in this document were discussed and addressed.  No physical exam could be performed with this format.  Please refer to the patient's chart for her  consent to telehealth for [facility].   Date:  01/23/2019   ID:  Erin Kaufman, DOB September 05, 1970, MRN [id number]  Patient Location: Home Provider Location: Office  PCP:  Arvella Nigh, MD  Cardiologist:  Dorris Carnes, MD  Electrophysiologist:  None   Evaluation Performed:  Follow-Up Visit  Chief Complaint:  F/Uf of CAD and aortic aneurysm  History of Present Illness:     Erin Kaufman is a 48 y.o. female with remote history of tobacco, hx of hyperlipidemia, FHx of CAD   I saw her in 2018    IN 2017 she had a cardiac Calcium score of 9   The ascending aorta was measured at 44 mm  I saw the patient in 2018   She saw Buren Kos in 2019    CT angiogram in April 2019 Asc aorta was 42 mm  The pt denies CP   Breathing is OK    No dizziness  No SOB    The patient does not have symptoms concerning for COVID-19 infection (fever, chills, cough, or new shortness of breath).    Past Medical History:  Diagnosis Date  . Anxiety   . GERD (gastroesophageal reflux disease)   . Hiatal hernia   . Hypertension   . Thyroid disease    Past Surgical History:  Procedure Laterality Date  . ABDOMINAL HYSTERECTOMY  2007      Current Meds  Medication Sig  . aspirin EC 81 MG tablet Take 1 tablet (81 mg total) by mouth daily.  . cholecalciferol (VITAMIN D) 1000 units tablet Take 1,000 Units by mouth daily.  Marland Kitchen NEXIUM 40 MG capsule TAKE 1 CAPSULE (40 MG TOTAL) BY MOUTH DAILY.  . rosuvastatin (CRESTOR) 20 MG tablet Take 1 tablet (20 mg total) by mouth daily.  Marland Kitchen SYNTHROID 125 MCG tablet Take 1 tablet (125 mcg total) by mouth daily.     Allergies:   Penicillins   Social History   Tobacco Use  . Smoking status: Current Some Day Smoker    Types: Cigarettes  . Smokeless tobacco: Never Used  . Tobacco comment: form given 03-20-12  Substance Use Topics  . Alcohol use: Yes    Comment: socially  . Drug use: No     Family Hx: The patient's family history includes Brain cancer in her mother; Diabetes in her mother; Heart disease in her mother; Hypertension in her mother; Lymphoma in her mother; Prostate cancer in her father. There is no history of Colon cancer.  ROS:   Please see the history of present illness.     All other systems reviewed and are negative.   Prior CV studies:   The following studies were reviewed today:    Labs/Other Tests and Data Reviewed:  EKG:  No ECG reviewed.  Recent Labs: No results found for requested labs within last 8760 hours.   Recent Lipid Panel Lab Results  Component Value Date/Time   CHOL 141 08/10/2017 09:16 AM   TRIG 94 08/10/2017 09:16 AM   HDL 46 08/10/2017 09:16 AM   CHOLHDL 3.1 08/10/2017 09:16 AM   CHOLHDL 6 08/22/2013 10:47 AM   LDLCALC 76 08/10/2017 09:16 AM    Wt Readings from Last 3 Encounters:  01/20/19 167 lb (75.8 kg)  12/07/17 167 lb 12.8 oz (76.1 kg)  06/18/17 177 lb (80.3 kg)     Objective:    Vital Signs:  BP (!) 135/97   Ht 5\' 1"  (1.549 m)   Wt 167 lb (75.8 kg)   BMI 31.55 kg/m    VITAL SIGNS:  reviewed  ASSESSMENT & PLAN:    1. HTN  BP is high.  WOuld recomm starting lisinopril 5 mg    May increase to 10     Will check labs  today    I have asked her to call into the office with pressures in a few wks    May need to arrange f/u    2. Aortic dilitation.   CT in 2019 showed very mild dilation    I would hold for now on repeating     COVID-19 Education: The signs and symptoms of COVID-19 were discussed with the patient and how to seek care for testing (follow up with PCP or arrange E-visit).  The importance of social distancing was discussed today.  Time:   Today, I have spent 15 minutes with the patient with telehealth technology discussing the above problems.     Medication Adjustments/Labs and Tests Ordered: Current medicines are reviewed at length with the patient today.  Concerns regarding medicines are outlined above.   Tests Ordered: No orders of the defined types were placed in this encounter.   Medication Changes: Meds ordered this encounter  Medications  . lisinopril (ZESTRIL) 10 MG tablet    Sig: Take 1 tablet (10 mg total) by mouth daily.    Dispense:  90 tablet    Refill:  3    Follow Up:  12 months  SOoner if BP does not become controlled    Signed, Dorris Carnes, MD  01/23/2019 11:52 PM    [facility]  Cardiology Office Note   Date:  01/20/2019   ID:  Erin Kaufman, DOB 07/10/1970, MRN [id number]  PCP:  Arvella Nigh, MD  Cardiologist:   Dorris Carnes, MD   Pt presents for f/u of cardiac risks and also thoracic aneurysm      History of Present Illness: Erin Kaufman is a 48 y.o. female with remote history of tobacco, hx of hyperlipidemia, FHx of CAD   I saw her in 2018    IN 2017 she had a cardiac Calcium score of 9   The ascending aorta was measured at 44 mm  I saw the patient last fall  She was seen in spring by Buren Kos   BP meds adjusted  The pt is feeling good   WORking out now 4x per week  No SOB   No CP   No dizziness BP at home 120s/70s   (was /90s)     Outpatient Medications Prior to Visit  Medication Sig Dispense Refill  . aspirin EC 81  MG tablet Take 1 tablet (81 mg total) by mouth daily. 90 tablet 3  .  cholecalciferol (VITAMIN D) 1000 units tablet Take 1,000 Units by mouth daily.    Marland Kitchen NEXIUM 40 MG capsule TAKE 1 CAPSULE (40 MG TOTAL) BY MOUTH DAILY. 30 capsule 6  . rosuvastatin (CRESTOR) 20 MG tablet Take 1 tablet (20 mg total) by mouth daily. 90 tablet 0  . SYNTHROID 125 MCG tablet Take 1 tablet (125 mcg total) by mouth daily. 30 tablet 11  . lisinopril (PRINIVIL,ZESTRIL) 10 MG tablet Take 1 tablet (10 mg total) by mouth daily. (Patient not taking: Reported on 01/20/2019) 90 tablet 3   No facility-administered medications prior to visit.      Allergies:   Penicillins   Past Medical History:  Diagnosis Date  . Anxiety   . GERD (gastroesophageal reflux disease)   . Hiatal hernia   . Hypertension   . Thyroid disease     Past Surgical History:  Procedure Laterality Date  . ABDOMINAL HYSTERECTOMY  2007     Social History:  The patient  reports that she has been smoking cigarettes. She has never used smokeless tobacco. She reports current alcohol use. She reports that she does not use drugs.   Family History:  The patient's family history includes Brain cancer in her mother; Diabetes in her mother; Heart disease in her mother; Hypertension in her mother; Lymphoma in her mother; Prostate cancer in her father.    ROS:  Please see the history of present illness. All other systems are reviewed and  Negative to the above problem except as noted.    PHYSICAL EXAM: VS:  BP (!) 135/97   Ht 5\' 1"  (1.549 m)   Wt 167 lb (75.8 kg)   BMI 31.55 kg/m   GEN: Well nourished, well developed, in no acute distress  HEENT: normal  Neck: JVP normal    NO, carotid bruits, or masses Cardiac: RRR; no murmurs, rubs, or gallops,no edema  Respiratory:  clear to auscultation bilaterally, normal work of breathing  Rhonchi   GI: soft, nontender, nondistended, + BS  No hepatomegaly  MS: no deformity Moving all extremities   Skin: warm  and dry, no rash Neuro:  Strength and sensation are intact Psych: euthymic mood, full affect  EKG is not done    Lipid Panel    Component Value Date/Time   CHOL 141 08/10/2017 0916   TRIG 94 08/10/2017 0916   HDL 46 08/10/2017 0916   CHOLHDL 3.1 08/10/2017 0916   CHOLHDL 6 08/22/2013 1047   VLDL 24.2 08/22/2013 1047   LDLCALC 76 08/10/2017 0916      Wt Readings from Last 3 Encounters:  01/20/19 167 lb (75.8 kg)  12/07/17 167 lb 12.8 oz (76.1 kg)  06/18/17 177 lb (80.3 kg)      ASSESSMENT AND PLAN: 1  Thoracic aneurysm   Last CT aorta was stable, actually measures 73mm   Will need periodic f/u    2  HTN   BP is good   Keep on same mesd   3HL  Continiue statin   PT working out 4x per week  [employer]  Encouraged her to keep that up      Signed, Dorris Carnes, MD  01/20/2019 9:56 AM    [address], [place], [place]  [address] Phone: [phone]; Fax: [phone]

## 2019-01-20 NOTE — Telephone Encounter (Signed)
Follow up     Pt is returning call for her appt    Please call back

## 2019-01-20 NOTE — Telephone Encounter (Signed)
Virtual Visit Pre-Appointment Phone Call  "(Name), I am calling you today to discuss your upcoming appointment. We are currently trying to limit exposure to the virus that causes COVID-19 by seeing patients at home rather than in the office."  1. "What is the BEST phone number to call the day of the visit?" - include this in appointment notes -[phone]  2. "Do you have or have access to (through a family member/friend) a smartphone with video capability that we can use for your visit?" VIDEO- a. If yes - list this number in appt notes as "cell" (if different from BEST phone #) and list the appointment type as a VIDEO visit in appointment notes b. If no - list the appointment type as a PHONE visit in appointment notes  3. Confirm consent - "In the setting of the current Covid19 crisis, you are scheduled for a video) visit with your provider on 01/20/19).  Just as we do with many in-office visits, in order for you to participate in this visit, we must obtain consent.  If you'd like, I can send this to your mychart (if signed up) or email for you to review.  Otherwise, I can obtain your verbal consent now.  All virtual visits are billed to your insurance company just like a normal visit would be.  By agreeing to a virtual visit, we'd like you to understand that the technology does not allow for your provider to perform an examination, and thus may limit your provider's ability to fully assess your condition. If your provider identifies any concerns that need to be evaluated in person, we will make arrangements to do so.  Finally, though the technology is pretty good, we cannot assure that it will always work on either your or our end, and in the setting of a video visit, we may have to convert it to a phone-only visit.  In either situation, we cannot ensure that we have a secure connection.  Are you willing to proceed?" STAFF: Did the patient verbally acknowledge consent to telehealth visit? Document  YES/NO here: yes  4. Advise patient to be prepared - "Two hours prior to your appointment, go ahead and check your blood pressure, pulse, oxygen saturation, and your weight (if you have the equipment to check those) and write them all down. When your visit starts, your provider will ask you for this information. If you have an Apple Watch or Kardia device, please plan to have heart rate information ready on the day of your appointment. Please have a pen and paper handy nearby the day of the visit as well."  5. Give patient instructions for MyChart download to smartphone OR Doximity/Doxy.me as below if video visit (depending on what platform provider is using)  6. Inform patient they will receive a phone call 15 minutes prior to their appointment time (may be from unknown caller ID) so they should be prepared to answer    TELEPHONE CALL NOTE  Erin Kaufman has been deemed a candidate for a follow-up tele-health visit to limit community exposure during the Covid-19 pandemic. I spoke with the patient via phone to ensure availability of phone/video source, confirm preferred email & phone number, and discuss instructions and expectations.  I reminded Erin Kaufman to be prepared with any vital sign and/or heart rhythm information that could potentially be obtained via home monitoring, at the time of her visit. I reminded Erin Kaufman to expect a phone call prior to her visit.  Leodis Liverpool, RN 01/20/2019 8:54 AM   INSTRUCTIONS FOR DOWNLOADING THE MYCHART APP TO SMARTPHONE  - The patient must first make sure to have activated MyChart and know their login information - If Apple, go to [employer] and type in MyChart in the search bar and download the app. If Android, ask patient to go to [employer] and type in [place] in the search bar and download the app. The app is free but as with any other app downloads, their phone may require them to verify saved payment information or  Apple/Android password.  - The patient will need to then log into the app with their MyChart username and password, and select [facility] as their healthcare provider to link the account. When it is time for your visit, go to the MyChart app, find appointments, and click Begin Video Visit. Be sure to Select Allow for your device to access the Microphone and Camera for your visit. You will then be connected, and your provider will be with you shortly.  **If they have any issues connecting, or need assistance please contact MyChart service desk [phone])**  **If using a computer, in order to ensure the best quality for their visit they will need to use either of the following Internet Browsers: [employer], or Google Chrome**  IF USING DOXIMITY or DOXY.ME - The patient will receive a link just prior to their visit by text.     FULL LENGTH CONSENT FOR TELE-HEALTH VISIT   I hereby voluntarily request, consent and authorize [facility] and its employed or contracted physicians, physician assistants, nurse practitioners or other licensed health care professionals (the Practitioner), to provide me with telemedicine health care services (the "Services") as deemed necessary by the treating Practitioner. I acknowledge and consent to receive the Services by the Practitioner via telemedicine. I understand that the telemedicine visit will involve communicating with the Practitioner through live audiovisual communication technology and the disclosure of certain medical information by electronic transmission. I acknowledge that I have been given the opportunity to request an in-person assessment or other available alternative prior to the telemedicine visit and am voluntarily participating in the telemedicine visit.  I understand that I have the right to withhold or withdraw my consent to the use of telemedicine in the course of my care at any time, without affecting my right to future care  or treatment, and that the Practitioner or I may terminate the telemedicine visit at any time. I understand that I have the right to inspect all information obtained and/or recorded in the course of the telemedicine visit and may receive copies of available information for a reasonable fee.  I understand that some of the potential risks of receiving the Services via telemedicine include:  Marland Kitchen Delay or interruption in medical evaluation due to technological equipment failure or disruption; . Information transmitted may not be sufficient (e.g. poor resolution of images) to allow for appropriate medical decision making by the Practitioner; and/or  . In rare instances, security protocols could fail, causing a breach of personal health information.  Furthermore, I acknowledge that it is my responsibility to provide information about my medical history, conditions and care that is complete and accurate to the best of my ability. I acknowledge that Practitioner's advice, recommendations, and/or decision may be based on factors not within their control, such as incomplete or inaccurate data provided by me or distortions of diagnostic images or specimens that may result from electronic transmissions. I understand that the  practice of medicine is not an Chief Strategy Officer and that Practitioner makes no warranties or guarantees regarding treatment outcomes. I acknowledge that I will receive a copy of this consent concurrently upon execution via email to the email address I last provided but may also request a printed copy by calling the office of [facility].    I understand that my insurance will be billed for this visit.   I have read or had this consent read to me. . I understand the contents of this consent, which adequately explains the benefits and risks of the Services being provided via telemedicine.  . I have been provided ample opportunity to ask questions regarding this consent and the Services and have had  my questions answered to my satisfaction. . I give my informed consent for the services to be provided through the use of telemedicine in my medical care  By participating in this telemedicine visit I agree to the above.

## 2019-03-04 ENCOUNTER — Other Ambulatory Visit: Payer: Self-pay | Admitting: *Deleted

## 2019-03-04 DIAGNOSIS — E782 Mixed hyperlipidemia: Secondary | ICD-10-CM

## 2019-03-04 NOTE — Progress Notes (Signed)
Spoke with patient and scheduled FLP on 03/18/19.

## 2019-03-18 ENCOUNTER — Other Ambulatory Visit: Payer: 59

## 2019-03-18 ENCOUNTER — Encounter (INDEPENDENT_AMBULATORY_CARE_PROVIDER_SITE_OTHER): Payer: Self-pay

## 2019-03-18 ENCOUNTER — Other Ambulatory Visit: Payer: Self-pay

## 2019-03-18 DIAGNOSIS — E782 Mixed hyperlipidemia: Secondary | ICD-10-CM

## 2019-03-18 LAB — LIPID PANEL
Chol/HDL Ratio: 2.3 ratio (ref 0.0–4.4)
Cholesterol, Total: 180 mg/dL (ref 100–199)
HDL: 78 mg/dL (ref >39)
LDL Chol Calc (NIH): 90 mg/dL (ref 0–99)
Triglycerides: 66 mg/dL (ref 0–149)
VLDL Cholesterol Cal: 12 mg/dL (ref 5–40)

## 2019-03-20 ENCOUNTER — Other Ambulatory Visit: Payer: Self-pay | Admitting: Internal Medicine

## 2019-03-29 IMAGING — CT CT ANGIO CHEST
2 of 7 series · 18 of 46 positions shown · IV contrast (iopamidol)
Comparison: 07/04/2016 and previous

CLINICAL DATA: Follow-up thoracic aortic aneurysm

EXAM:
CT ANGIOGRAPHY CHEST WITH CONTRAST
TECHNIQUE: Multidetector CT imaging of the chest was performed using the
standard protocol during bolus administration of intravenous
contrast. Multiplanar CT image reconstructions and MIPs were
obtained to evaluate the vascular anatomy.
CONTRAST:  100mL ZNQDQG-RYT IOPAMIDOL (ZNQDQG-RYT) INJECTION 76%

[Series 4: aorta 3.0 i31f 2 · axial · 0.79mm/px · z∈[-292,-7]mm · 15 of 103 slices shown]
[im 4/103  lung]
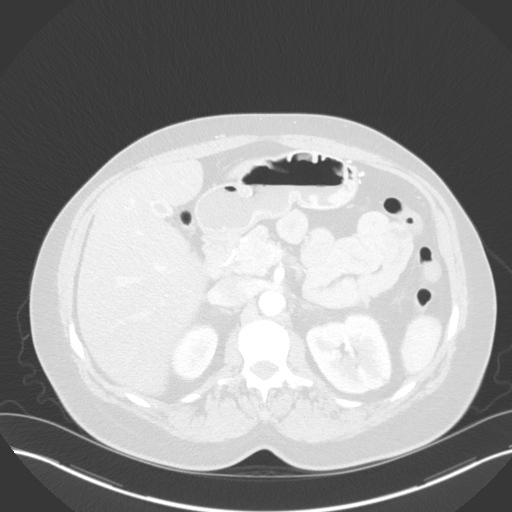
[im 11/103  soft-tissue]
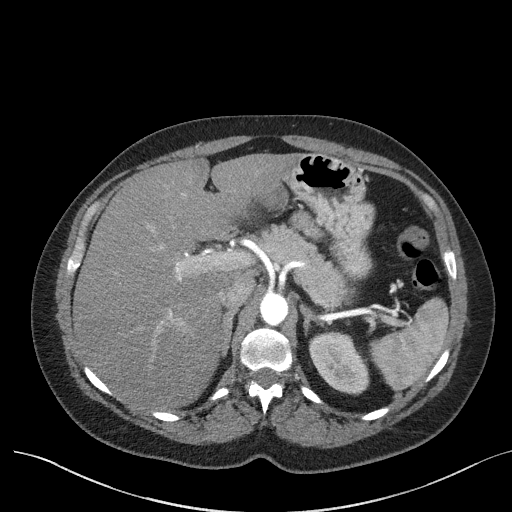
[im 19/103  lung]
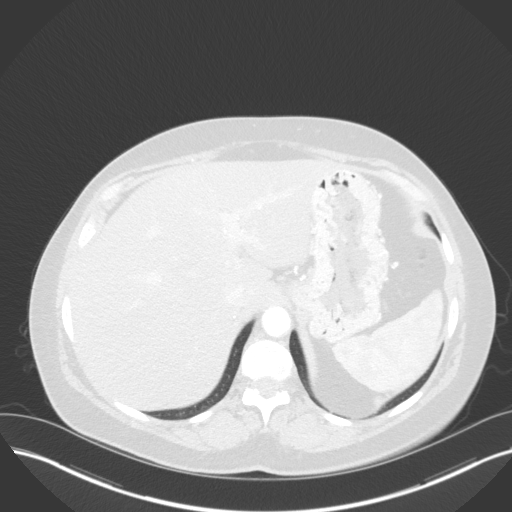
[im 26/103  soft-tissue]
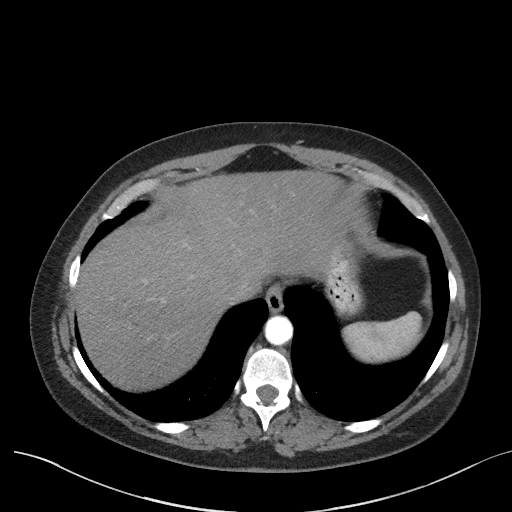
[im 33/103  lung]
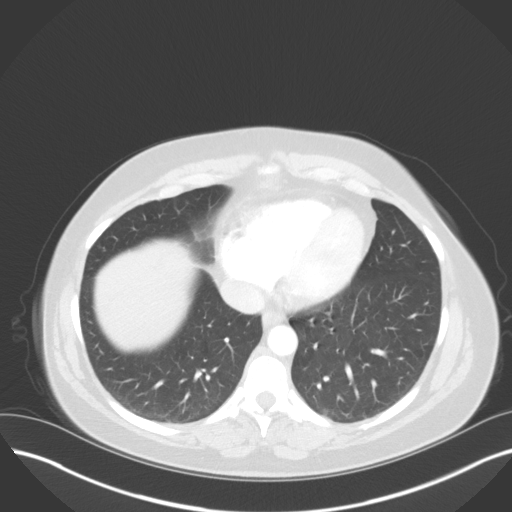
[im 37/103  soft-tissue]
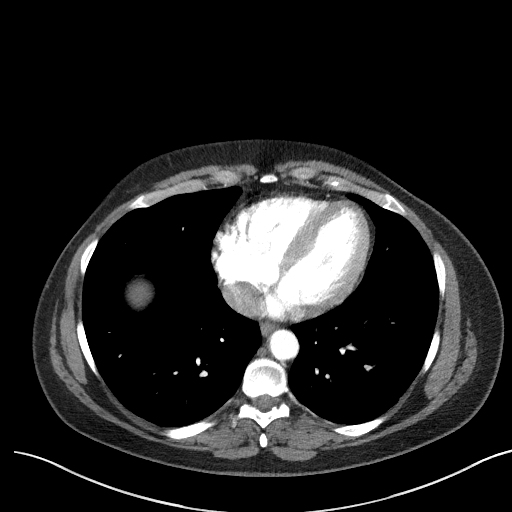
[im 44/103  lung]
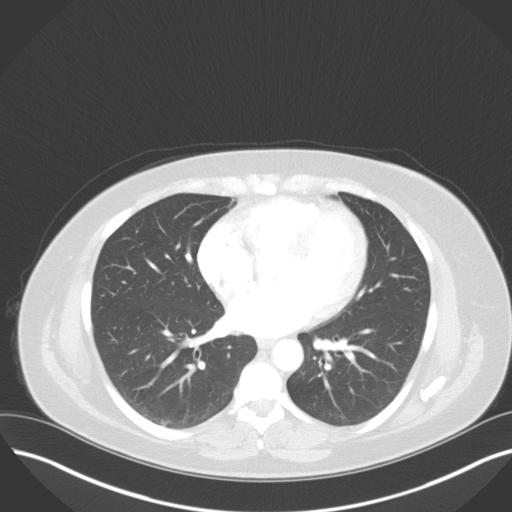
[im 52/103  soft-tissue]
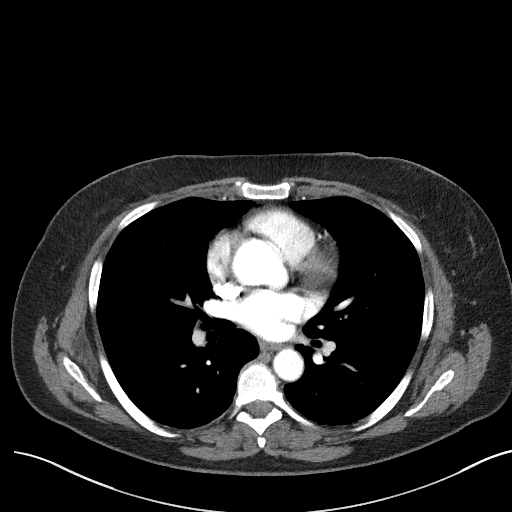
[im 59/103  lung]
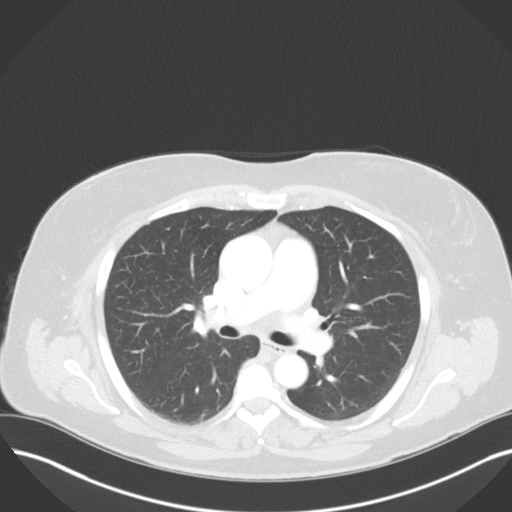
[im 66/103  soft-tissue]
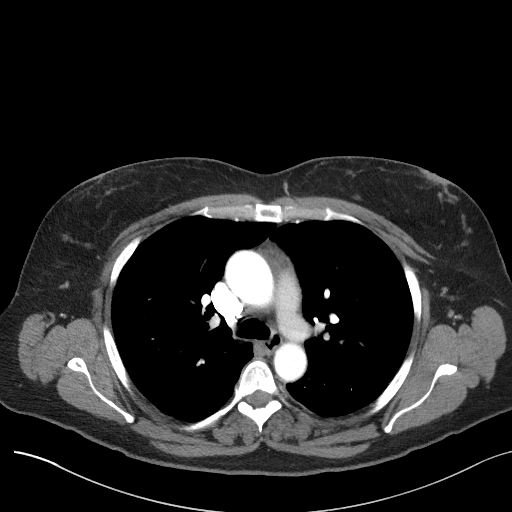
[im 70/103  lung]
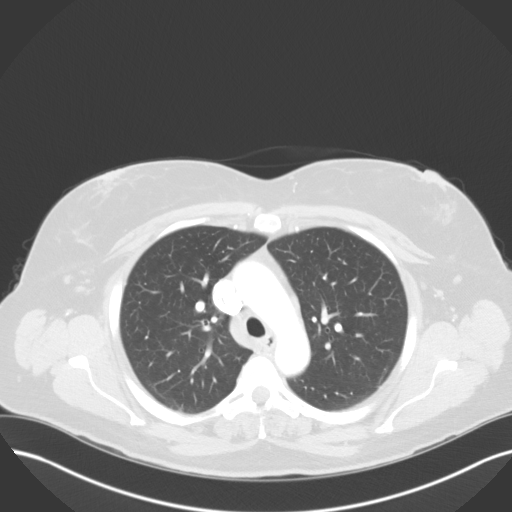
[im 77/103  soft-tissue]
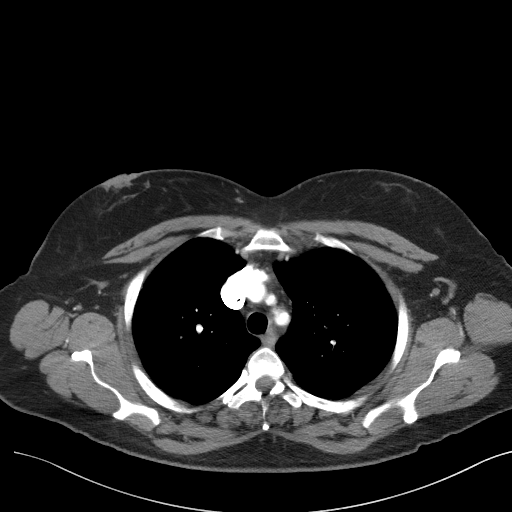
[im 84/103  lung]
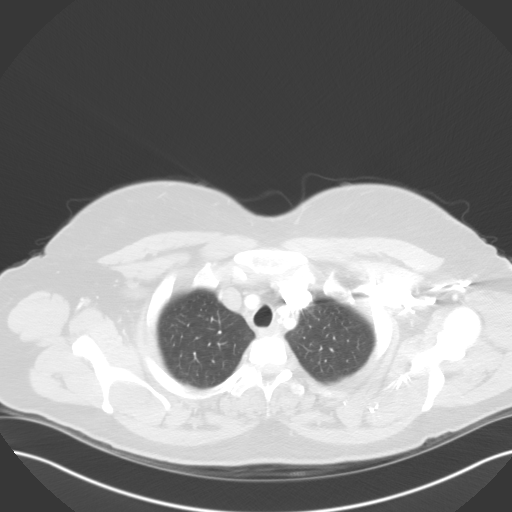
[im 92/103  soft-tissue]
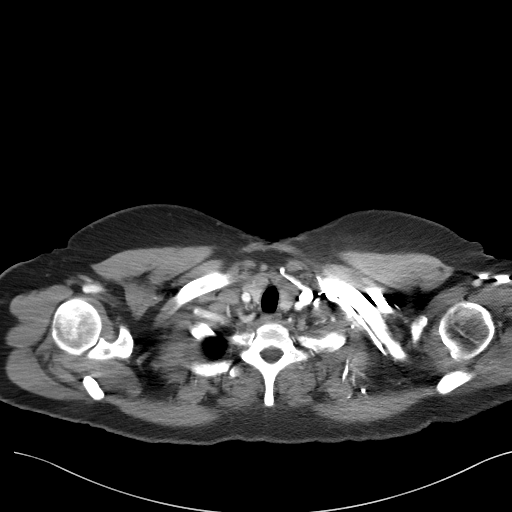
[im 99/103  lung]
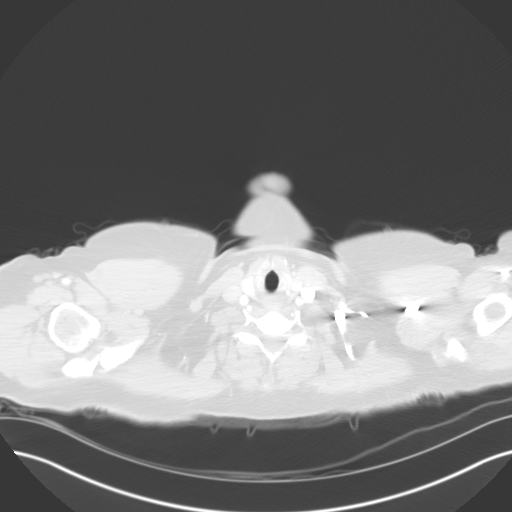

[Series 7: coronals · coronal · 0.62mm/px · 3 of 116 slices shown]
[im 29/116  soft-tissue]
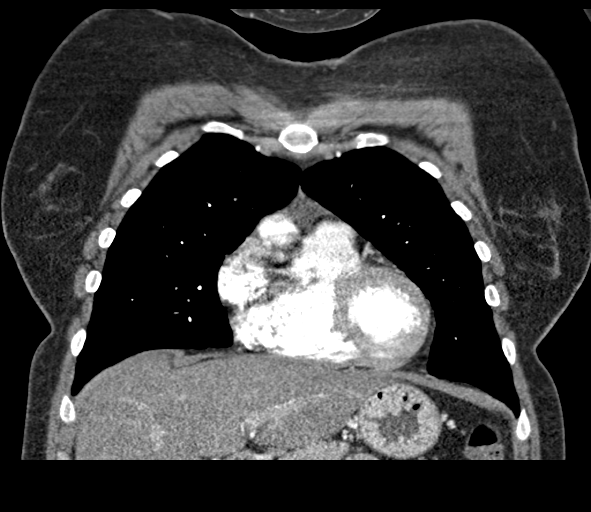
[im 58/116  soft-tissue]
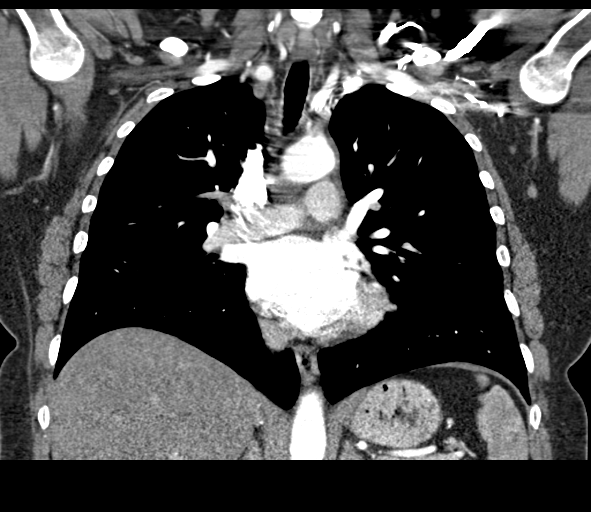
[im 87/116  soft-tissue]
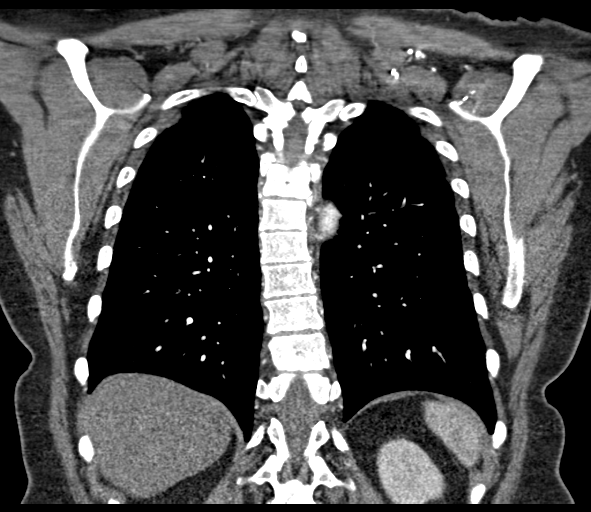

[18 of 46 positions shown; findings below may reference images not displayed]

FINDINGS: Cardiovascular: Heart size normal. No pericardial effusion. Fair
contrast opacification of the pulmonary artery branches; the exam
was not optimized for detection of pulmonary emboli. Thoracic aortic
transverse dimensions as follows

3.5 cm sinuses of Valsalva

3.1 cm Lya junction

4.2 cm mid ascending (previously 4.2)

3.2 cm distal ascending/proximal arch

2.3 cm distal arch/proximal descending

2.4 cm distal descending

No dissection. No significant atheromatous irregularity. Patent
brachiocephalic arterial origin anatomy. The left vertebral artery
arises directly from the arch, an anatomic variant. Visualized
proximal abdominal aorta unremarkable. Separate origins of splenic
and common hepatic arteries, an anatomic variant.

Mediastinum/Nodes: No enlarged mediastinal, hilar, or axillary lymph
nodes. Thyroid gland, trachea, and esophagus demonstrate no
significant findings.

Lungs/Pleura: Stable right 6 mm intrapulmonary lymph node image
54/5. Lungs otherwise clear. No pleural effusion. No pneumothorax.

Upper Abdomen: No acute findings

Musculoskeletal: Mild thoracic dextroscoliosis without evident
underlying vertebral anomaly. No fracture or worrisome bone lesion.

Review of the MIP images confirms the above findings.
IMPRESSION: 1. Stable 4.2 cm ascending aortic aneurysm without complicating
features.

## 2019-06-12 ENCOUNTER — Other Ambulatory Visit: Payer: Self-pay | Admitting: Internal Medicine

## 2020-01-09 ENCOUNTER — Other Ambulatory Visit: Payer: Self-pay | Admitting: Internal Medicine

## 2020-01-26 ENCOUNTER — Other Ambulatory Visit: Payer: Self-pay

## 2020-01-26 ENCOUNTER — Encounter: Payer: Self-pay | Admitting: Internal Medicine

## 2020-01-26 ENCOUNTER — Ambulatory Visit: Payer: 59 | Admitting: Internal Medicine

## 2020-01-26 VITALS — BP 146/86 | HR 99 | Ht 61.0 in | Wt 170.8 lb

## 2020-01-26 DIAGNOSIS — I10 Essential (primary) hypertension: Secondary | ICD-10-CM

## 2020-01-26 DIAGNOSIS — I712 Thoracic aortic aneurysm, without rupture, unspecified: Secondary | ICD-10-CM

## 2020-01-26 DIAGNOSIS — E782 Mixed hyperlipidemia: Secondary | ICD-10-CM

## 2020-01-26 MED ORDER — HYDROCHLOROTHIAZIDE 12.5 MG PO CAPS: 12.5000 mg | ORAL_CAPSULE | Freq: Every day | ORAL | 3 refills | Status: DC

## 2020-01-26 NOTE — Progress Notes (Signed)
Cardiology Office Note   Date:  01/26/2020   ID:  Erin Kaufman, DOB Jul 08, 1970, MRN [id number]  PCP:  Arvella Nigh, MD  Cardiologist:   Dorris Carnes, MD    Patient presents for follow-up of hypertension, CAD   History of Present Illness: Erin Kaufman is a 49 y.o. female with remote history of tobacco, hx of hyperlipidemia, FHx of CAD  IN 2017 she had a cardiac Calcium score of 9   The ascending aorta was measured at 44 mm  I saw the patient in 2018   She saw Buren Kos in 2019    T angiogram in April 2019 Asc aorta was 42 mm  The pt was last seen as a televisit in 2020.  since seen she says she has been feeling good.  She denies chest pain.  Breathing is okay.  She remains active.     Current Meds  Medication Sig  . aspirin EC 81 MG tablet Take 1 tablet (81 mg total) by mouth daily.  . cholecalciferol (VITAMIN D) 1000 units tablet Take 1,000 Units by mouth daily.  Marland Kitchen lisinopril (ZESTRIL) 10 MG tablet TAKE 1 TABLET BY MOUTH EVERY DAY  . NEXIUM 40 MG capsule TAKE 1 CAPSULE (40 MG TOTAL) BY MOUTH DAILY.  . rosuvastatin (CRESTOR) 20 MG tablet TAKE 1 TABLET BY MOUTH EVERY DAY  . SYNTHROID 125 MCG tablet Take 1 tablet (125 mcg total) by mouth daily.     Allergies:   Penicillins   Past Medical History:  Diagnosis Date  . Anxiety   . GERD (gastroesophageal reflux disease)   . Hiatal hernia   . Hypertension   . Thyroid disease     Past Surgical History:  Procedure Laterality Date  . ABDOMINAL HYSTERECTOMY  2007     Social History:  The patient  reports that she has been smoking cigarettes. She has never used smokeless tobacco. She reports current alcohol use. She reports that she does not use drugs.   Family History:  The patient's family history includes Brain cancer in her mother; Diabetes in her mother; Heart disease in her mother; Hypertension in her mother; Lymphoma in her mother; Prostate cancer in her father.    ROS:  Please see the history of present  illness. All other systems are reviewed and  Negative to the above problem except as noted.    PHYSICAL EXAM: VS:  BP (!) 146/86   Pulse 99   Ht 5' 1"  (7.793 m)   Wt 170 lb 12.8 oz (77.5 kg)   SpO2 96%   BMI 32.27 kg/m   GEN: Obese 49 year old in no acute distress  HEENT: normal  Neck: no JVD, carotid bruits, Cardiac: RRR; no murmurs, rubs, or gallops,no lower extremity edema  Respiratory:  clear to auscultation bilaterally, GI: soft, nontender, nondistended, + BS  No hepatomegaly  MS: no deformity Moving all extremities   Skin: warm and dry, no rash Neuro:  Strength and sensation are intact Psych: euthymic mood, full affect   EKG:  EKG is ordered today.  Sinus rhythm 99 bpm   Lipid Panel    Component Value Date/Time   CHOL 180 03/18/2019 0936   TRIG 66 03/18/2019 0936   HDL 78 03/18/2019 0936   CHOLHDL 2.3 03/18/2019 0936   CHOLHDL 6 08/22/2013 1047   VLDL 24.2 08/22/2013 1047   LDLCALC 90 03/18/2019 0936      Wt Readings from Last 3 Encounters:  01/26/20 170 lb 12.8 oz (77.5 kg)  01/20/19 167 lb (75.8 kg)  12/07/17 167 lb 12.8 oz (76.1 kg)      ASSESSMENT AND PLAN:  1.  CAD.  Patient with a calcium score of 9 on CT scan.  She has been treated medically.  She denies any symptoms to suggest angina would follow  2 aortic aneurysm.  Patient has had mild dilation of the ascending aorta in the mid region last check was 2 years ago at 42 mm we will schedule a chest MRI to reevaluate  3 dyslipidemia we will check a lipoma panel today.  Her lipids were better a couple years ago than they were on this last check.  We will get back with her.  Keep on same medicines.  4.  Hypertension blood pressure is not optimally controlled.  Would add 12.5 of hydrochlorothiazide to her regimen.  If she tolerates this and her blood pressure improves then I would put it as a pill lisinopril-HCTZ.  Check be met today.  We will check be met, CBC, lipoma and TSH.  The patient will write  back with her blood pressures in about 1 month's time.  Otherwise I will plan to see her in 1 year.  If needed we can see sooner.   Current medicines are reviewed at length with the patient today.  The patient does not have concerns regarding medicines.  Signed, Dorris Carnes, MD  01/26/2020 3:21 PM    [address], [place], [place]  [address] Phone: [phone]; Fax: [phone]

## 2020-01-26 NOTE — Patient Instructions (Signed)
Medication Instructions:  Your physician has recommended you make the following change in your medication:  1.) start hydrochlorothiazide (hctz) 12.5 mg - one tablet daily  *If you need a refill on your cardiac medications before your next appointment, please call your pharmacy*   Lab Work: Today: NMR Lipomed Panel, tsh, bmet, cbc  If you have labs (blood work) drawn today and your tests are completely normal, you will receive your results only by: Marland Kitchen MyChart Message (if you have MyChart) OR . A paper copy in the mail If you have any lab test that is abnormal or we need to change your treatment, we will call you to review the results.   Testing/Procedures: Chest MRI - follow up of thoracic aorta   Follow-Up: At [facility], you and your health needs are our priority.  As part of our continuing mission to provide you with exceptional heart care, we have created designated Provider Care Teams.  These Care Teams include your primary Cardiologist (physician) and Advanced Practice Providers (APPs -  Physician Assistants and Nurse Practitioners) who all work together to provide you with the care you need, when you need it.  We recommend signing up for the patient portal called "MyChart".  Sign up information is provided on this After Visit Summary.  MyChart is used to connect with patients for Virtual Visits (Telemedicine).  Patients are able to view lab/test results, encounter notes, upcoming appointments, etc.  Non-urgent messages can be sent to your provider as well.   To learn more about what you can do with MyChart, go to [email or web address].    Your next appointment:   12 month(s)  The format for your next appointment:   In Person  Provider:   You may see Dorris Carnes, MD or one of the following Advanced Practice Providers on your designated Care Team:    Richardson Dopp, PA-C  Robbie Lis, [place]    Other Instructions

## 2020-01-27 LAB — NMR, LIPOPROFILE
Cholesterol, Total: 180 mg/dL (ref 100–199)
HDL Particle Number: 39.3 umol/L (ref >=30.5)
HDL-C: 76 mg/dL (ref >39)
LDL Particle Number: 810 nmol/L (ref <1000)
LDL Size: 21.5 nm (ref >20.5)
LDL-C (NIH Calc): 92 mg/dL (ref 0–99)
LP-IR Score: <25 (ref <=45)
Small LDL Particle Number: <90 nmol/L (ref <=527)
Triglycerides: 62 mg/dL (ref 0–149)

## 2020-01-27 LAB — APOLIPOPROTEIN B: Apolipoprotein B: 75 mg/dL (ref <90)

## 2020-01-27 LAB — BASIC METABOLIC PANEL
BUN/Creatinine Ratio: 10 (ref 9–23)
BUN: 6 mg/dL (ref 6–24)
CO2: 23 mmol/L (ref 20–29)
Calcium: 9.9 mg/dL (ref 8.7–10.2)
Chloride: 101 mmol/L (ref 96–106)
Creatinine, Ser: 0.6 mg/dL (ref 0.57–1.00)
GFR calc Af Amer: 125 mL/min/{1.73_m2} (ref >59)
GFR calc non Af Amer: 108 mL/min/{1.73_m2} (ref >59)
Glucose: 94 mg/dL (ref 65–99)
Potassium: 5 mmol/L (ref 3.5–5.2)
Sodium: 138 mmol/L (ref 134–144)

## 2020-01-27 LAB — CBC
Hematocrit: 49.8 % — ABNORMAL HIGH (ref 34.0–46.6)
Hemoglobin: 17.4 g/dL — ABNORMAL HIGH (ref 11.1–15.9)
MCH: 33.2 pg — ABNORMAL HIGH (ref 26.6–33.0)
MCHC: 34.9 g/dL (ref 31.5–35.7)
MCV: 95 fL (ref 79–97)
Platelets: 233 10*3/uL (ref 150–450)
RBC: 5.24 x10E6/uL (ref 3.77–5.28)
RDW: 12.6 % (ref 11.7–15.4)
WBC: 6.5 10*3/uL (ref 3.4–10.8)

## 2020-01-27 LAB — TSH: TSH: 0.966 u[IU]/mL (ref 0.450–4.500)

## 2020-01-27 LAB — LIPOPROTEIN A (LPA): Lipoprotein (a): 9.8 nmol/L (ref <75.0)

## 2020-02-06 ENCOUNTER — Other Ambulatory Visit: Payer: Self-pay

## 2020-02-06 ENCOUNTER — Ambulatory Visit (HOSPITAL_COMMUNITY)
Admission: RE | Admit: 2020-02-06 | Discharge: 2020-02-06 | Disposition: A | Discharge status: Home / Self Care | Payer: 59 | Source: Ambulatory Visit | Attending: Internal Medicine | Admitting: Internal Medicine

## 2020-02-06 DIAGNOSIS — I712 Thoracic aortic aneurysm, without rupture, unspecified: Secondary | ICD-10-CM

## 2020-02-06 MED ORDER — GADOBUTROL 1 MMOL/ML IV SOLN
7.5000 mL | Freq: Once | INTRAVENOUS | Status: AC | PRN
  Administered 2020-02-06: 7.5 mL via INTRAVENOUS

## 2020-02-10 ENCOUNTER — Other Ambulatory Visit: Payer: Self-pay | Admitting: *Deleted

## 2020-02-10 DIAGNOSIS — D582 Other hemoglobinopathies: Secondary | ICD-10-CM

## 2020-02-18 ENCOUNTER — Other Ambulatory Visit: Payer: 59

## 2020-03-17 ENCOUNTER — Other Ambulatory Visit: Payer: Self-pay | Admitting: Internal Medicine

## 2020-12-30 ENCOUNTER — Other Ambulatory Visit: Payer: Self-pay | Admitting: Internal Medicine

## 2021-01-05 ENCOUNTER — Encounter: Payer: Self-pay | Admitting: Internal Medicine

## 2021-01-08 ENCOUNTER — Other Ambulatory Visit: Payer: Self-pay | Admitting: Internal Medicine

## 2021-02-07 ENCOUNTER — Other Ambulatory Visit: Payer: Self-pay

## 2021-02-07 ENCOUNTER — Ambulatory Visit (AMBULATORY_SURGERY_CENTER): Payer: Self-pay

## 2021-02-07 VITALS — Ht 60.0 in | Wt 160.0 lb

## 2021-02-07 DIAGNOSIS — Z1211 Encounter for screening for malignant neoplasm of colon: Secondary | ICD-10-CM

## 2021-02-07 MED ORDER — NA SULFATE-K SULFATE-MG SULF 17.5-3.13-1.6 GM/177ML PO SOLN: 1.0000 | Freq: Once | ORAL | 0 refills | Status: AC

## 2021-02-07 NOTE — Progress Notes (Signed)

## 2021-02-21 ENCOUNTER — Encounter: Payer: Self-pay | Admitting: Internal Medicine

## 2021-02-25 ENCOUNTER — Encounter: Payer: Self-pay | Admitting: Internal Medicine

## 2021-02-25 ENCOUNTER — Ambulatory Visit (AMBULATORY_SURGERY_CENTER): Payer: 59 | Admitting: Internal Medicine

## 2021-02-25 VITALS — BP 134/84 | HR 87 | Temp 98.1°F | Resp 20 | Ht 60.0 in | Wt 160.0 lb

## 2021-02-25 DIAGNOSIS — D125 Benign neoplasm of sigmoid colon: Secondary | ICD-10-CM

## 2021-02-25 DIAGNOSIS — D128 Benign neoplasm of rectum: Secondary | ICD-10-CM | POA: Diagnosis not present

## 2021-02-25 DIAGNOSIS — D122 Benign neoplasm of ascending colon: Secondary | ICD-10-CM

## 2021-02-25 DIAGNOSIS — Z1211 Encounter for screening for malignant neoplasm of colon: Secondary | ICD-10-CM

## 2021-02-25 DIAGNOSIS — D124 Benign neoplasm of descending colon: Secondary | ICD-10-CM

## 2021-02-25 DIAGNOSIS — D120 Benign neoplasm of cecum: Secondary | ICD-10-CM

## 2021-02-25 DIAGNOSIS — D123 Benign neoplasm of transverse colon: Secondary | ICD-10-CM

## 2021-02-25 MED ORDER — SODIUM CHLORIDE 0.9 % IV SOLN: 500.0000 mL | Freq: Once | INTRAVENOUS | Status: DC

## 2021-02-25 NOTE — Progress Notes (Signed)
Report to PACU, RN, vss, BBS= Clear.  

## 2021-02-25 NOTE — Progress Notes (Signed)
Pt's states no medical or surgical changes since previsit or office visit.  Vitals DT 

## 2021-02-25 NOTE — Progress Notes (Signed)
HISTORY OF PRESENT ILLNESS:  Erin Kaufman is a 50 y.o. female presents today for an Colonoscopy.  No complaints  REVIEW OF SYSTEMS:  All non-GI ROS negative. Past Medical History:  Diagnosis Date   Anxiety    GERD (gastroesophageal reflux disease)    on meds   Hiatal hernia    Hyperlipidemia    on meds   Hypertension    on meds   Thyroid disease    on meds    Past Surgical History:  Procedure Laterality Date   FINGER SURGERY Left 2019   3rd and 4th metatarsal fracture   UPPER GASTROINTESTINAL ENDOSCOPY  2010   Dr. Garnette Gunner   VAGINAL HYSTERECTOMY  2007   [name]  reports that she has been smoking cigarettes. She has a 25.00 pack-year smoking history. She has never used smokeless tobacco. She reports current alcohol use of about 4.0 - 6.0 standard drinks per week. She reports that she does not use drugs.  family history includes Brain cancer in her mother; Diabetes in her mother; Heart disease in her mother; Hypertension in her mother; Lymphoma in her mother; Prostate cancer in her father.  Allergies  Allergen Reactions   Penicillins Swelling and Rash    All over the body       PHYSICAL EXAMINATION:  Vital signs: BP (!) 140/91    Pulse 91    Temp 98.1 F (36.7 C) (Temporal)    Resp 17    Ht 5' (1.524 m)    Wt 160 lb (72.6 kg)    SpO2 98%    BMI 31.25 kg/m  General: Well-developed, well-nourished, no acute distress HEENT: Sclerae are anicteric, conjunctiva pink. Oral mucosa intact Lungs: Clear Heart: Regular Abdomen: soft, nontender, nondistended, no obvious ascites, no peritoneal signs, normal bowel sounds. No organomegaly. Extremities: No edema Psychiatric: alert and oriented x3. Cooperative     ASSESSMENT:  1.  Colon cancer screening.  Average risk   PLAN:  1.  Screening colonoscopy

## 2021-02-25 NOTE — Op Note (Signed)
[place] Patient Name: Erin Kaufman Procedure Date: 02/25/2021 2:15 PM MRN: [id number] Endoscopist: Docia Chuck. [name] , MD Age: 50 Referring MD:  Date of Birth: [birth year] Gender: Female Account #: [id number] Procedure:                Colonoscopy with cold snare polypectomy x 10; hot                            snare x 3 Indications:              Screening for colorectal malignant neoplasm Medicines:                Monitored Anesthesia Care Procedure:                Pre-Anesthesia Assessment:                           - Prior to the procedure, a History and Physical                            was performed, and patient medications and                            allergies were reviewed. The patient's tolerance of                            previous anesthesia was also reviewed. The risks                            and benefits of the procedure and the sedation                            options and risks were discussed with the patient.                            All questions were answered, and informed consent                            was obtained. Prior Anticoagulants: The patient has                            taken no previous anticoagulant or antiplatelet                            agents. ASA Grade Assessment: II - A patient with                            mild systemic disease. After reviewing the risks                            and benefits, the patient was deemed in                            satisfactory condition to undergo the procedure.  After obtaining informed consent, the colonoscope                            was passed under direct vision. Throughout the                            procedure, the patient's blood pressure, pulse, and                            oxygen saturations were monitored continuously. The                            CF [id number] #[id number] was introduced through the anus                            and advanced to  the the cecum, identified by                            appendiceal orifice and ileocecal valve. The                            ileocecal valve, appendiceal orifice, and rectum                            were photographed. The quality of the bowel                            preparation was excellent. The colonoscopy was                            performed without difficulty. The patient tolerated                            the procedure well. The bowel preparation used was                            SUPREP via split dose instruction. Scope In: 2:21:27 PM Scope Out: 1:66:06 PM Scope Withdrawal Time: 0 hours 30 minutes 55 seconds  Total Procedure Duration: 0 hours 35 minutes 19 seconds  Findings:                 Multiple polyps were found in the rectum and cecum.                            The polyps were 2 to 12 mm in size. 10 polyps were                            removed with a cold snare. 3 were removed with hot                            snare (proximal ascending and 2 rectal), see  images. Resection and retrieval were complete.                           Multiple diverticula were found in the left colon                            and right colon.                           Internal hemorrhoids were found during retroflexion.                           The exam was otherwise without abnormality on                            direct and retroflexion views. Complications:            No immediate complications. Estimated blood loss:                            None. Estimated Blood Loss:     Estimated blood loss: none. Impression:               - Multiple 2 to 12 mm polyps in the rectum and in                            the cecum, removed with hot and cold snare                            techniques. Resected and retrieved.                           - Diverticulosis in the left colon and in the right                            colon.                           -  Internal hemorrhoids.                           - The examination was otherwise normal on direct                            and retroflexion views. Recommendation:           - Repeat colonoscopy in 1 year for surveillance.                           - Patient has a contact number available for                            emergencies. The signs and symptoms of potential                            delayed complications were discussed with the  patient. Return to normal activities tomorrow.                            Written discharge instructions were provided to the                            patient.                           - Resume previous diet.                           - Continue present medications.                           - Await pathology results. Docia Chuck. [name], MD 02/25/2021 3:06:46 PM This report has been signed electronically.

## 2021-02-25 NOTE — Progress Notes (Signed)
Called to room to assist during endoscopic procedure.  Patient ID and intended procedure confirmed with present staff. Received instructions for my participation in the procedure from the performing physician.  

## 2021-02-25 NOTE — Patient Instructions (Addendum)
Handouts on polyps, hemorrhoids, and diverticulosis given to you today   YOU HAD AN ENDOSCOPIC PROCEDURE TODAY AT [place]:   Refer to the procedure report that was given to you for any specific questions about what was found during the examination.  If the procedure report does not answer your questions, please call your gastroenterologist to clarify.  If you requested that your care partner not be given the details of your procedure findings, then the procedure report has been included in a sealed envelope for you to review at your convenience later.  YOU SHOULD EXPECT: Some feelings of bloating in the abdomen. Passage of more gas than usual.  Walking can help get rid of the air that was put into your GI tract during the procedure and reduce the bloating. If you had a lower endoscopy (such as a colonoscopy or flexible sigmoidoscopy) you may notice spotting of blood in your stool or on the toilet paper. If you underwent a bowel prep for your procedure, you may not have a normal bowel movement for a few days.  Please Note:  You might notice some irritation and congestion in your nose or some drainage.  This is from the oxygen used during your procedure.  There is no need for concern and it should clear up in a day or so.  SYMPTOMS TO REPORT IMMEDIATELY:  Following lower endoscopy (colonoscopy or flexible sigmoidoscopy):  Excessive amounts of blood in the stool  Significant tenderness or worsening of abdominal pains  Swelling of the abdomen that is new, acute  Fever of 100F or higher  For urgent or emergent issues, a gastroenterologist can be reached at any hour by calling [phone]. Do not use MyChart messaging for urgent concerns.    DIET:  We do recommend a small meal at first, but then you may proceed to your regular diet.  Drink plenty of fluids but you should avoid alcoholic beverages for 24 hours.  ACTIVITY:  You should plan to take it easy for the rest of today  and you should NOT DRIVE or use heavy machinery until tomorrow (because of the sedation medicines used during the test).    FOLLOW UP: Our staff will call the number listed on your records 48-72 hours following your procedure to check on you and address any questions or concerns that you may have regarding the information given to you following your procedure. If we do not reach you, we will leave a message.  We will attempt to reach you two times.  During this call, we will ask if you have developed any symptoms of COVID 19. If you develop any symptoms (ie: fever, flu-like symptoms, shortness of breath, cough etc.) before then, please call [phone].  If you test positive for Covid 19 in the 2 weeks post procedure, please call and report this information to [place].    If any biopsies were taken you will be contacted by phone or by letter within the next 1-3 weeks.  Please call us at [phone] if you have not heard about the biopsies in 3 weeks.    SIGNATURES/CONFIDENTIALITY: You and/or your care partner have signed paperwork which will be entered into your electronic medical record.  These signatures attest to the fact that that the information above on your After Visit Summary has been reviewed and is understood.  Full responsibility of the confidentiality of this discharge information lies with you and/or your care-partner.

## 2021-02-26 ENCOUNTER — Emergency Department (HOSPITAL_COMMUNITY): Payer: 59

## 2021-02-26 ENCOUNTER — Encounter (HOSPITAL_COMMUNITY): Payer: Self-pay | Admitting: Radiology

## 2021-02-26 ENCOUNTER — Emergency Department (HOSPITAL_COMMUNITY): Payer: 59 | Admitting: Certified Registered Nurse Anesthetist

## 2021-02-26 ENCOUNTER — Inpatient Hospital Stay (HOSPITAL_COMMUNITY)
Admission: EM | Admit: 2021-02-26 | Discharge: 2021-02-28 | DRG: 919 | Disposition: A | Discharge status: Home / Self Care | Payer: 59 | Attending: Internal Medicine | Admitting: Internal Medicine

## 2021-02-26 ENCOUNTER — Encounter (HOSPITAL_COMMUNITY)
Admission: EM | Disposition: A | Discharge status: Home / Self Care | Payer: Self-pay | Source: Home / Self Care | Attending: Internal Medicine

## 2021-02-26 ENCOUNTER — Other Ambulatory Visit: Payer: Self-pay

## 2021-02-26 DIAGNOSIS — D62 Acute posthemorrhagic anemia: Secondary | ICD-10-CM | POA: Diagnosis present

## 2021-02-26 DIAGNOSIS — E785 Hyperlipidemia, unspecified: Secondary | ICD-10-CM | POA: Diagnosis present

## 2021-02-26 DIAGNOSIS — I10 Essential (primary) hypertension: Secondary | ICD-10-CM | POA: Diagnosis present

## 2021-02-26 DIAGNOSIS — K633 Ulcer of intestine: Secondary | ICD-10-CM | POA: Diagnosis present

## 2021-02-26 DIAGNOSIS — Z79890 Hormone replacement therapy (postmenopausal): Secondary | ICD-10-CM

## 2021-02-26 DIAGNOSIS — E039 Hypothyroidism, unspecified: Secondary | ICD-10-CM | POA: Diagnosis present

## 2021-02-26 DIAGNOSIS — K5731 Diverticulosis of large intestine without perforation or abscess with bleeding: Secondary | ICD-10-CM | POA: Diagnosis present

## 2021-02-26 DIAGNOSIS — Z9889 Other specified postprocedural states: Secondary | ICD-10-CM | POA: Diagnosis not present

## 2021-02-26 DIAGNOSIS — Z860100 Personal history of colon polyps, unspecified: Secondary | ICD-10-CM

## 2021-02-26 DIAGNOSIS — Y838 Other surgical procedures as the cause of abnormal reaction of the patient, or of later complication, without mention of misadventure at the time of the procedure: Secondary | ICD-10-CM | POA: Diagnosis present

## 2021-02-26 DIAGNOSIS — Z79899 Other long term (current) drug therapy: Secondary | ICD-10-CM

## 2021-02-26 DIAGNOSIS — K625 Hemorrhage of anus and rectum: Secondary | ICD-10-CM | POA: Diagnosis not present

## 2021-02-26 DIAGNOSIS — K91840 Postprocedural hemorrhage and hematoma of a digestive system organ or structure following a digestive system procedure: Secondary | ICD-10-CM | POA: Diagnosis present

## 2021-02-26 DIAGNOSIS — Z833 Family history of diabetes mellitus: Secondary | ICD-10-CM | POA: Diagnosis not present

## 2021-02-26 DIAGNOSIS — K922 Gastrointestinal hemorrhage, unspecified: Secondary | ICD-10-CM | POA: Diagnosis not present

## 2021-02-26 DIAGNOSIS — K760 Fatty (change of) liver, not elsewhere classified: Secondary | ICD-10-CM | POA: Diagnosis present

## 2021-02-26 DIAGNOSIS — D500 Iron deficiency anemia secondary to blood loss (chronic): Secondary | ICD-10-CM | POA: Diagnosis not present

## 2021-02-26 DIAGNOSIS — E876 Hypokalemia: Secondary | ICD-10-CM | POA: Diagnosis present

## 2021-02-26 DIAGNOSIS — Z20822 Contact with and (suspected) exposure to covid-19: Secondary | ICD-10-CM | POA: Diagnosis present

## 2021-02-26 DIAGNOSIS — Z8249 Family history of ischemic heart disease and other diseases of the circulatory system: Secondary | ICD-10-CM

## 2021-02-26 DIAGNOSIS — F17210 Nicotine dependence, cigarettes, uncomplicated: Secondary | ICD-10-CM | POA: Diagnosis present

## 2021-02-26 DIAGNOSIS — K219 Gastro-esophageal reflux disease without esophagitis: Secondary | ICD-10-CM | POA: Diagnosis present

## 2021-02-26 DIAGNOSIS — Z7982 Long term (current) use of aspirin: Secondary | ICD-10-CM | POA: Diagnosis not present

## 2021-02-26 DIAGNOSIS — R578 Other shock: Secondary | ICD-10-CM | POA: Diagnosis present

## 2021-02-26 DIAGNOSIS — R55 Syncope and collapse: Secondary | ICD-10-CM | POA: Insufficient documentation

## 2021-02-26 HISTORY — PX: HEMOSTASIS CLIP PLACEMENT: SHX6857

## 2021-02-26 HISTORY — PX: COLONOSCOPY WITH PROPOFOL: SHX5780

## 2021-02-26 LAB — HEMOGLOBIN AND HEMATOCRIT, BLOOD
HCT: 39.7 % (ref 36.0–46.0)
HCT: 40 % (ref 36.0–46.0)
HCT: 35.2 % — ABNORMAL LOW (ref 36.0–46.0)
Hemoglobin: 13.7 g/dL (ref 12.0–15.0)
Hemoglobin: 13.8 g/dL (ref 12.0–15.0)
Hemoglobin: 12.4 g/dL (ref 12.0–15.0)

## 2021-02-26 LAB — DIC (DISSEMINATED INTRAVASCULAR COAGULATION)PANEL
D-Dimer, Quant: 1.42 ug/mL-FEU — ABNORMAL HIGH (ref 0.00–0.50)
Fibrinogen: 246 mg/dL (ref 210–475)
INR: 1.2 (ref 0.8–1.2)
Platelets: 146 10*3/uL — ABNORMAL LOW (ref 150–400)
Prothrombin Time: 14.9 seconds (ref 11.4–15.2)
Smear Review: NONE SEEN
aPTT: 27 seconds (ref 24–36)

## 2021-02-26 LAB — COMPREHENSIVE METABOLIC PANEL
ALT: 17 U/L (ref 0–44)
AST: 18 U/L (ref 15–41)
Albumin: 2.6 g/dL — ABNORMAL LOW (ref 3.5–5.0)
Alkaline Phosphatase: 41 U/L (ref 38–126)
Anion gap: 13 (ref 5–15)
BUN: 7 mg/dL (ref 6–20)
CO2: 17 mmol/L — ABNORMAL LOW (ref 22–32)
Calcium: 7.2 mg/dL — ABNORMAL LOW (ref 8.9–10.3)
Chloride: 103 mmol/L (ref 98–111)
Creatinine, Ser: 0.95 mg/dL (ref 0.44–1.00)
GFR, Estimated: >60 mL/min (ref >60)
Glucose, Bld: 123 mg/dL — ABNORMAL HIGH (ref 70–99)
Potassium: 3.2 mmol/L — ABNORMAL LOW (ref 3.5–5.1)
Sodium: 133 mmol/L — ABNORMAL LOW (ref 135–145)
Total Bilirubin: 0.9 mg/dL (ref 0.3–1.2)
Total Protein: 4.5 g/dL — ABNORMAL LOW (ref 6.5–8.1)

## 2021-02-26 LAB — CBC WITH DIFFERENTIAL/PLATELET
Abs Immature Granulocytes: 0.05 10*3/uL (ref 0.00–0.07)
Basophils Absolute: 0 10*3/uL (ref 0.0–0.1)
Basophils Relative: 0 %
Eosinophils Absolute: 0 10*3/uL (ref 0.0–0.5)
Eosinophils Relative: 0 %
HCT: 30.6 % — ABNORMAL LOW (ref 36.0–46.0)
Hemoglobin: 10.1 g/dL — ABNORMAL LOW (ref 12.0–15.0)
Immature Granulocytes: 1 %
Lymphocytes Relative: 15 %
Lymphs Abs: 1.2 10*3/uL (ref 0.7–4.0)
MCH: 32.8 pg (ref 26.0–34.0)
MCHC: 33 g/dL (ref 30.0–36.0)
MCV: 99.4 fL (ref 80.0–100.0)
Monocytes Absolute: 0.5 10*3/uL (ref 0.1–1.0)
Monocytes Relative: 6 %
Neutro Abs: 5.9 10*3/uL (ref 1.7–7.7)
Neutrophils Relative %: 78 %
Platelets: 207 10*3/uL (ref 150–400)
RBC: 3.08 MIL/uL — ABNORMAL LOW (ref 3.87–5.11)
RDW: 13 % (ref 11.5–15.5)
WBC: 7.6 10*3/uL (ref 4.0–10.5)
nRBC: 0 % (ref 0.0–0.2)

## 2021-02-26 LAB — I-STAT BETA HCG BLOOD, ED (MC, WL, AP ONLY): I-stat hCG, quantitative: <5 m[IU]/mL (ref <5)

## 2021-02-26 LAB — MRSA NEXT GEN BY PCR, NASAL: MRSA by PCR Next Gen: NOT DETECTED

## 2021-02-26 LAB — PREPARE RBC (CROSSMATCH)

## 2021-02-26 LAB — PROTIME-INR
INR: 1.2 (ref 0.8–1.2)
Prothrombin Time: 15.1 seconds (ref 11.4–15.2)

## 2021-02-26 LAB — RESP PANEL BY RT-PCR (FLU A&B, COVID) ARPGX2
Influenza A by PCR: NEGATIVE
Influenza B by PCR: NEGATIVE
SARS Coronavirus 2 by RT PCR: NEGATIVE

## 2021-02-26 LAB — ABO/RH: ABO/RH(D): O POS

## 2021-02-26 LAB — HIV ANTIBODY (ROUTINE TESTING W REFLEX): HIV Screen 4th Generation wRfx: NONREACTIVE

## 2021-02-26 SURGERY — COLONOSCOPY WITH PROPOFOL
Anesthesia: Monitor Anesthesia Care

## 2021-02-26 MED ORDER — OXYCODONE HCL 5 MG PO TABS: 5.0000 mg | ORAL_TABLET | Freq: Once | ORAL | Status: DC | PRN

## 2021-02-26 MED ORDER — PROPOFOL 10 MG/ML IV BOLUS
INTRAVENOUS | Status: DC | PRN
  Administered 2021-02-26: 10 mg via INTRAVENOUS
  Administered 2021-02-26: 20 mg via INTRAVENOUS

## 2021-02-26 MED ORDER — ONDANSETRON HCL 4 MG/2ML IJ SOLN: 4.0000 mg | Freq: Four times a day (QID) | INTRAMUSCULAR | Status: DC | PRN

## 2021-02-26 MED ORDER — CHLORHEXIDINE GLUCONATE 0.12 % MT SOLN
15.0000 mL | Freq: Two times a day (BID) | OROMUCOSAL | Status: DC
  Administered 2021-02-26: 15 mL via OROMUCOSAL

## 2021-02-26 MED ORDER — IOHEXOL 350 MG/ML SOLN
100.0000 mL | Freq: Once | INTRAVENOUS | Status: AC | PRN
  Administered 2021-02-26: 100 mL via INTRAVENOUS

## 2021-02-26 MED ORDER — LIDOCAINE 2% (20 MG/ML) 5 ML SYRINGE
INTRAMUSCULAR | Status: DC | PRN
  Administered 2021-02-26: 40 mg via INTRAVENOUS

## 2021-02-26 MED ORDER — POTASSIUM CHLORIDE 10 MEQ/100ML IV SOLN
10.0000 meq | INTRAVENOUS | Status: AC
  Administered 2021-02-26 (×2): 10 meq via INTRAVENOUS
  Filled 2021-02-26: qty 100

## 2021-02-26 MED ORDER — PROPOFOL 500 MG/50ML IV EMUL
INTRAVENOUS | Status: DC | PRN
  Administered 2021-02-26: 125 ug/kg/min via INTRAVENOUS

## 2021-02-26 MED ORDER — FENTANYL CITRATE (PF) 100 MCG/2ML IJ SOLN: 25.0000 ug | INTRAMUSCULAR | Status: DC | PRN

## 2021-02-26 MED ORDER — OXYCODONE HCL 5 MG/5ML PO SOLN: 5.0000 mg | Freq: Once | ORAL | Status: DC | PRN

## 2021-02-26 MED ORDER — CALCIUM GLUCONATE-NACL 1-0.675 GM/50ML-% IV SOLN
INTRAVENOUS | Status: AC
  Administered 2021-02-26: 1000 mg
  Filled 2021-02-26: qty 50

## 2021-02-26 MED ORDER — POTASSIUM CHLORIDE 10 MEQ/100ML IV SOLN
10.0000 meq | INTRAVENOUS | Status: AC
  Filled 2021-02-26 (×2): qty 100

## 2021-02-26 MED ORDER — SODIUM CHLORIDE 0.9% IV SOLUTION: Freq: Once | INTRAVENOUS | Status: DC

## 2021-02-26 MED ORDER — ONDANSETRON HCL 4 MG/2ML IJ SOLN
4.0000 mg | Freq: Four times a day (QID) | INTRAMUSCULAR | Status: DC | PRN
  Administered 2021-02-26: 4 mg via INTRAVENOUS
  Filled 2021-02-26: qty 2

## 2021-02-26 MED ORDER — CHLORHEXIDINE GLUCONATE CLOTH 2 % EX PADS
6.0000 | MEDICATED_PAD | Freq: Every day | CUTANEOUS | Status: DC
  Administered 2021-02-26 – 2021-02-27 (×2): 6 via TOPICAL

## 2021-02-26 MED ORDER — LACTATED RINGERS IV SOLN: INTRAVENOUS | Status: DC | PRN

## 2021-02-26 MED ORDER — DOCUSATE SODIUM 100 MG PO CAPS: 100.0000 mg | ORAL_CAPSULE | Freq: Two times a day (BID) | ORAL | Status: DC | PRN

## 2021-02-26 MED ORDER — POLYETHYLENE GLYCOL 3350 17 G PO PACK: 17.0000 g | PACK | Freq: Every day | ORAL | Status: DC | PRN

## 2021-02-26 SURGICAL SUPPLY — 22 items
ELECT REM PT RETURN 9FT ADLT (ELECTROSURGICAL)
ELECTRODE REM PT RTRN 9FT ADLT (ELECTROSURGICAL) IMPLANT
FCP BXJMBJMB 240X2.8X (CUTTING FORCEPS)
FLOOR PAD 36X40 (MISCELLANEOUS) ×4
FORCEPS BIOP RAD 4 LRG CAP 4 (CUTTING FORCEPS) IMPLANT
FORCEPS BIOP RJ4 240 W/NDL (CUTTING FORCEPS)
FORCEPS BXJMBJMB 240X2.8X (CUTTING FORCEPS) IMPLANT
INJECTOR/SNARE I SNARE (MISCELLANEOUS) IMPLANT
LUBRICANT JELLY 4.5OZ STERILE (MISCELLANEOUS) IMPLANT
MANIFOLD NEPTUNE II (INSTRUMENTS) IMPLANT
NDL SCLEROTHERAPY 25GX240 (NEEDLE) IMPLANT
NEEDLE SCLEROTHERAPY 25GX240 (NEEDLE) IMPLANT
PAD FLOOR 36X40 (MISCELLANEOUS) ×3 IMPLANT
PROBE APC STR FIRE (PROBE) IMPLANT
PROBE INJECTION GOLD (MISCELLANEOUS)
PROBE INJECTION GOLD 7FR (MISCELLANEOUS) IMPLANT
SNARE ROTATE MED OVAL 20MM (MISCELLANEOUS) IMPLANT
SYR 50ML LL SCALE MARK (SYRINGE) IMPLANT
TRAP SPECIMEN MUCOUS 40CC (MISCELLANEOUS) IMPLANT
TUBING ENDO SMARTCAP PENTAX (MISCELLANEOUS) IMPLANT
TUBING IRRIGATION ENDOGATOR (MISCELLANEOUS) ×5 IMPLANT
WATER STERILE IRR 1000ML POUR (IV SOLUTION) IMPLANT

## 2021-02-26 NOTE — Progress Notes (Signed)
[facility] Progress Note Patient Name: Erin Kaufman DOB: [birth year] MRN: [id number]   Date of Service  02/26/2021  HPI/Events of Note  Pt admitted with blood loss anemia  eICU Interventions  Has received mult units of prbc Pt alert, family visiting.  Stable.     Intervention Category Evaluation Type: New Patient Evaluation  [place] 02/26/2021, 9:11 PM

## 2021-02-26 NOTE — Transfer of Care (Signed)
Immediate Anesthesia Transfer of Care Note  Patient: Erin Kaufman  Procedure(s) Performed: COLONOSCOPY WITH PROPOFOL HEMOSTASIS CLIP PLACEMENT  Patient Location: PACU  Anesthesia Type:MAC  Level of Consciousness: awake, alert  and oriented  Airway & Oxygen Therapy: Patient Spontanous Breathing  Post-op Assessment: Report given to RN and Post -op Vital signs reviewed and stable  Post vital signs: Reviewed and stable  Last Vitals:  Vitals Value Taken Time  BP 114/95 02/26/21 1110  Temp    Pulse 88 02/26/21 1111  Resp 17 02/26/21 1111  SpO2 100 % 02/26/21 1111  Vitals shown include unvalidated device data.  Last Pain:  Vitals:   02/26/21 0919  TempSrc: Temporal  PainSc: 0-No pain         Complications: No notable events documented.

## 2021-02-26 NOTE — Consult Note (Signed)
Consultation  Referring Provider:     Guilford Shi Primary Care Physician:  Arvella Nigh, MD Primary Gastroenterologist:        Scarlette Shorts Reason for Consultation:     post polypectomy bleed         HPI:   Erin Kaufman is a 50 y.o. female with a history of colon polyps, hypertension, hyperlipidemia, presenting to the emergency room this morning with bright red blood per rectum.  Consulted by Dr. Vallery Ridge in the ER  Patient had a colonoscopy with Dr. [name] yesterday afternoon, this was her first colonoscopy for screening purposes.  She had 13 polyps removed, majority cold but 3 hot snare's -multiple polyps removed from the rectum and right colon.  Husband states she developed some mild rectal bleeding when she got home from the procedure.  Sounds like she passed some more blood and clots in the middle the night and then she woke up at 430 this morning with a lot of blood in the bed.  She went to the bathroom and passed a large amount of blood and had a syncopal episode.  Husband found her down and called EMS.  Blood pressure was as low as 40/20 when they found her, they gave her a liter of fluids and went to the emergency room.  Her blood pressures were 78M systolic in the ED.  She was given 2 units rapid transfusion which improved her blood pressure slightly but then became hypotensive again, given another 2 units rapid infused RBC and blood pressure and heart rate significantly improved.  BP on evaluation in the ED right now is 767M systolic with a heart rate in the 80s.  She denies any abdominal pain now.  She is quite fatigued and sleeping.  Husband at the bedside.  She has not had any significant output since she is been in the ED.  Hemoglobin when she came into the ED was 10, down from baseline of 16-17s. IR was initially consulted from the ED given her severe hypotension and significant blood loss.  They recommended a CT angiogram.  I reviewed this with Dr. Pascal Lux.  There is  significant blood products in the left colon and rectum, not so much in the right colon.  There is no active extravasation on the exam.  Dr. Pascal Lux suspect this is more than likely a rectal bleed given majority of blood is noted in that area but cannot say for sure.  Patient denies any blood thinners.  She does take a baby aspirin.  She last ate yesterday evening.   Past Medical History:  Diagnosis Date   Anxiety    GERD (gastroesophageal reflux disease)    on meds   Hiatal hernia    Hyperlipidemia    on meds   Hypertension    on meds   Thyroid disease    on meds    Past Surgical History:  Procedure Laterality Date   FINGER SURGERY Left 2019   3rd and 4th metatarsal fracture   UPPER GASTROINTESTINAL ENDOSCOPY  2010   Dr. Garnette Gunner   VAGINAL HYSTERECTOMY  2007   WISDOM TOOTH EXTRACTION  1988    Family History  Problem Relation Age of Onset   Brain cancer Mother        tumor - did not specify type   Lymphoma Mother    Diabetes Mother    Hypertension Mother    Heart disease Mother    Prostate cancer Father    Colon  cancer Neg Hx    Colon polyps Neg Hx    Esophageal cancer Neg Hx    Rectal cancer Neg Hx    Stomach cancer Neg Hx      Social History   Tobacco Use   Smoking status: Every Day    Packs/day: 1.00    Years: 25.00    Pack years: 25.00    Types: Cigarettes   Smokeless tobacco: Never   Tobacco comments:    form given 03-20-12  Vaping Use   Vaping Use: Never used  Substance Use Topics   Alcohol use: Yes    Alcohol/week: 4.0 - 6.0 standard drinks    Types: 4 - 6 Standard drinks or equivalent per week    Comment: socially   Drug use: Never    Prior to Admission medications   Medication Sig Start Date End Date Taking? Authorizing Provider  Ascorbic Acid (VITAMIN C PO) Take 2 tablets by mouth daily as needed. During winter months    [provider]  aspirin EC 81 MG tablet Take 1 tablet (81 mg total) by mouth daily. 12/04/16   [name], MD   cholecalciferol (VITAMIN D) 1000 units tablet Take 1,000 Units by mouth daily.    [provider]  hydrochlorothiazide (MICROZIDE) 12.5 MG capsule TAKE 1 CAPSULE BY MOUTH EVERY DAY 01/10/21   [name], MD  lisinopril (ZESTRIL) 10 MG tablet TAKE 1 TABLET BY MOUTH EVERY DAY 12/30/20   [name], MD  NEXIUM 40 MG capsule TAKE 1 CAPSULE (40 MG TOTAL) BY MOUTH DAILY. 03/29/13   Irene Shipper, MD  rosuvastatin (CRESTOR) 20 MG tablet TAKE 1 TABLET BY MOUTH EVERY DAY 03/18/20   [name], MD  SYNTHROID 125 MCG tablet Take 1 tablet (125 mcg total) by mouth daily. 11/27/12   Armandina Gemma, MD    Current Facility-Administered Medications  Medication Dose Route Frequency Provider Last Rate Last Admin   0.9 %  sodium chloride infusion (Manually program via Guardrails IV Fluids)   Intravenous Once Charlesetta Shanks, MD       Current Outpatient Medications  Medication Sig Dispense Refill   Ascorbic Acid (VITAMIN C PO) Take 2 tablets by mouth daily as needed. During winter months     aspirin EC 81 MG tablet Take 1 tablet (81 mg total) by mouth daily. 90 tablet 3   cholecalciferol (VITAMIN D) 1000 units tablet Take 1,000 Units by mouth daily.     hydrochlorothiazide (MICROZIDE) 12.5 MG capsule TAKE 1 CAPSULE BY MOUTH EVERY DAY 90 capsule 3   lisinopril (ZESTRIL) 10 MG tablet TAKE 1 TABLET BY MOUTH EVERY DAY 90 tablet 0   NEXIUM 40 MG capsule TAKE 1 CAPSULE (40 MG TOTAL) BY MOUTH DAILY. 30 capsule 6   rosuvastatin (CRESTOR) 20 MG tablet TAKE 1 TABLET BY MOUTH EVERY DAY 90 tablet 3   SYNTHROID 125 MCG tablet Take 1 tablet (125 mcg total) by mouth daily. 30 tablet 11    Allergies as of 02/26/2021 - Review Complete 02/26/2021  Allergen Reaction Noted   Penicillins Swelling and Rash      Review of Systems:    As per HPI, otherwise negative    Physical Exam:  Vital signs in last 24 hours: Temp:  [98.4 F (36.9 C)] 98.4 F (36.9 C) (12/17 0650) Pulse Rate:  [80-101] 90 (12/17  0855) Resp:  [15-27] 16 (12/17 0855) BP: (42-125)/(25-96) 103/85 (12/17 0855) SpO2:  [98 %-100 %] 99 % (12/17 0855)  General:   Pleasant female in NAD, resting in bed Head:  Normocephalic and atraumatic. Eyes:   No icterus.   Conjunctiva pale Ears:  Normal auditory acuity. Neck:  Supple Lungs:  Respirations even and unlabored. Lungs clear to auscultation bilaterally.    Heart:  Regular rate and rhythm; no MRG Abdomen:  Soft, nondistended, nontender. No appreciable masses or hepatomegaly.  Rectal:  Not performed.  Msk:  Symmetrical without gross deformities.  Extremities:  Without edema. Neurologic:  Alert and  oriented x4;  grossly normal neurologically. Skin:  Intact without significant lesions or rashes. Psych:  Alert and cooperative. Normal affect.  LAB RESULTS: Recent Labs    02/26/21 0654  WBC 7.6  HGB 10.1*  HCT 30.6*  PLT 207   BMET Recent Labs    02/26/21 0654  NA 133*  K 3.2*  CL 103  CO2 17*  GLUCOSE 123*  BUN 7  CREATININE 0.95  CALCIUM 7.2*   LFT Recent Labs    02/26/21 0654  PROT 4.5*  ALBUMIN 2.6*  AST 18  ALT 17  ALKPHOS 41  BILITOT 0.9   PT/INR Recent Labs    02/26/21 0710  LABPROT 15.1  INR 1.2    STUDIES: DG Chest Port 1 View  Result Date: 02/26/2021 CLINICAL DATA:  Pt bib [facility] from home with GI bleed. Had colonoscopy yesterday where she had 13 polyps removed. Woke up this morning to go to the bathroom and had a syncopal episodeHx of GERD, HTNSmoker EXAM: PORTABLE CHEST 1 VIEW COMPARISON:  CTA chest, 07/10/2017. FINDINGS: Normal heart, mediastinum and hila. Clear lungs.  No convincing pleural effusion and no pneumothorax. Skeletal structures are grossly intact. IMPRESSION: No active disease. Electronically Signed   By: Lajean Manes M.D.   On: 02/26/2021 08:45   CT Angio Abd/Pel w/ and/or w/o  Result Date: 02/26/2021 CLINICAL DATA:  Post polypectomy, now with lower GI bleeding. EXAM: CTA ABDOMEN AND PELVIS WITHOUT AND  WITH CONTRAST TECHNIQUE: Multidetector CT imaging of the abdomen and pelvis was performed using the standard protocol during bolus administration of intravenous contrast. Multiplanar reconstructed images and MIPs were obtained and reviewed to evaluate the vascular anatomy. CONTRAST:  140mL OMNIPAQUE IOHEXOL 350 MG/ML SOLN COMPARISON:  None. FINDINGS: VASCULAR Aorta: Scattered mixed calcified and noncalcified atherosclerotic plaque within a normal caliber abdominal aorta, not resulting in a hemodynamically significant stenosis. No evidence of abdominal aortic dissection or periaortic stranding. Celiac: Note is made of separate origins of the common hepatic and splenic arteries in lieu of a conventional celiac trunk. Both vessels appear widely patent without a hemodynamically significant narrowing. SMA: Widely patent without hemodynamically significant narrowing. Conventional branching pattern. The distal tributaries of the SMA appear widely patent without discrete intraluminal filling defect to suggest distal embolism. Renals: Solitary bilaterally; the bilateral renal arteries are widely patent without a hemodynamically significant narrowing. No vessel irregularity to suggest FMD. IMA: Remains patent. Inflow: There is a very minimal amount of eccentric calcified atherosclerotic plaque involving the distal aspect the left common iliac artery, not resulting in hemodynamically significant stenosis. The bilateral common, external and internal iliac arteries are of normal caliber and widely patent without hemodynamically significant narrowing. Proximal Outflow: There is a minimal amount of noncalcified atherosclerotic plaque involving the bilateral common femoral arteries, not resulting in a hemodynamically significant stenosis. The imaged portions of the bilateral deep and superficial femoral arteries are of normal caliber and widely patent without a hemodynamically significant narrowing. Veins: The IVC and pelvic venous  systems appear widely patent. Review of the MIP images confirms the above findings. _________________________________________________________ NON-VASCULAR Lower chest: Limited visualization of the lower thorax demonstrates minimal dependent subpleural ground-glass atelectasis. No discrete focal airspace opacities. No pleural effusion. Normal heart size.  No pericardial effusion. Hepatobiliary: Normal hepatic contour. There is diffuse decreased attenuation of the hepatic parenchyma suggestive of hepatic steatosis. No discrete hepatic lesions. Normal appearance of the gallbladder given degree distention. No intra or extrahepatic biliary duct dilatation. No ascites. Pancreas: Normal appearance of the pancreas. Spleen: Normal appearance of the spleen. Adrenals/Urinary Tract: There is symmetric enhancement of the bilateral kidneys. Subcentimeter hypoattenuating left-sided renal lesions are too small to accurately characterize though favored to represent additional renal cysts. No discrete worrisome renal lesions. Review of the noncontrast images is negative for the presence of nephrolithiasis. There is a very minimal amount of likely body habitus related perinephric stranding. No urine obstruction. Normal appearance of the bilateral adrenal glands. Normal appearance of the urinary bladder given degree of distention. Stomach/Bowel: Large amount of high density debris is seen within the distal descending colon as well as the sigmoid colon and rectum compatible with provided history of recent GI bleeding. Despite suspected intraluminal blood, there are no discrete areas of intraluminal contrast extravasation to identify the etiology of the lower GI bleeding. Rather extensive colonic diverticulosis without evidence superimposed acute diverticulitis. Moderate colonic stool burden without evidence of enteric obstruction. Normal appearance of the terminal ileum and the retrocecal appendix. No pneumoperitoneum, pneumatosis or  portal venous gas. Lymphatic: No bulky retroperitoneal, mesenteric, pelvic or inguinal lymphadenopathy. Reproductive: Post hysterectomy. No discrete adnexal lesions. No free fluid the pelvic cul-de-sac. Other: Tiny mesenteric fat containing periumbilical hernia. There is a minimal amount of subcutaneous edema about the midline of the low back. Musculoskeletal: No acute or aggressive osseous abnormalities. Bilateral L5 pars defects with associated grade 1 anterolisthesis of L5 upon S1 measuring approximately 5 mm. IMPRESSION: 1. Large amount of blood products primarily within the distal descending colon, sigmoid colon and rectum without discrete area of intraluminal contrast extravasation to suggest the etiology of the lower GI bleeding. 2. Scattered minimal amount of atherosclerotic plaque within a normal caliber abdominal aorta, not resulting in hemodynamically significant stenosis. Aortic Atherosclerosis (ICD10-I70.0). 3. Colonic diverticulosis without evidence superimposed acute diverticulitis. 4. Hepatic steatosis.  Correlation with LFTs is advised. Critical Value/emergent results were called by telephone at the time of interpretation on 02/26/2021 at 8:26 am to provider [facility] , who verbally acknowledged these results. PLAN: Above discussed with attending gastroenterologist, Dr. Havery Moros who will either consider repeat colonoscopy versus proceeding with tagged red blood cell study pending his evaluation of the patient's hemodynamic stability. Electronically Signed   By: Sandi Mariscal M.D.   On: 02/26/2021 09:00       Impression / Plan:   50 year old female with a colonoscopy yesterday and 13 polyps removed, both hot (3) and cold (10), from the right colon and rectum, presenting with significant post polypectomy bleed.  Quite hypotensive upon initial evaluation, status post 4 units rapid transfusion and now hemodynamics are much better.  CTA did not show active extravasation which is good, bleeding  has certainly slowed, but given burden of blood in the rectum suspect that may be the more likely area where this is coming from.  Discussed the situation with the patient and her husband.  Now that she has stabilized I think an unprepped colonoscopy is best plan to try to evaluate source of bleeding and treat this.  We  may start this unsedated if the source is in the rectum and we find it there, but anesthesia will be there to assist should she need it for full exam.  Discussed risks / benefits of colonoscopy and anesthesia with the patient and she wants to proceed.  Hopefully we can identify the source and treat endoscopically and avoid any other therapy.  If significant bleeding with poor visualization endoscopically or does not tolerate sedation due to hypotension etc., may need IR involvement, hopefully that is not necessary.  Patient and husband are understanding of this occurrence, counseled them this is not common following colonoscopy but certainly can occur when polyps are removed, especially given the number and size of polyps she had removed.  We are hoping to do the colonoscopy urgently in the Endo suite now. Will continue to trend Hgb and transfuse as needed.  Jolly Mango, MD [facility]astroenter[facility]

## 2021-02-26 NOTE — ED Triage Notes (Signed)
Pt [facility] from home with GI bleed. Had colonoscopy yesterday where she had 13 polyps removed. Woke up this morning to go to the bathroom and had a syncopal episode. Pt noted several clots in bed and bathroom. EMS noted two additional syncopal episodes. BP initially 80/40 then 40/20 per EMS

## 2021-02-26 NOTE — ED Notes (Signed)
Received phone call from endo. Per endo, they are coming to transport pt. Report given to endo.

## 2021-02-26 NOTE — ED Notes (Signed)
Patient transported to CT 

## 2021-02-26 NOTE — Progress Notes (Signed)
° °  Evaluation after Contrast Extravasation  Patient seen and examined after contrast extravasation.  Exam: There is minimal swelling at the upper left arm.  There is no erythema. There is no discoloration. There are no blisters. There are no signs of decreased perfusion of the skin.  It is not warm to touch.  The patient has good ROM in fingers.  Radial pulse normal.  Per contrast extravasation protocol, I have instructed the patient to keep an ice pack on the area for 20-60 minutes at a time for about 48 hours.   Keep arm elevated as much as possible.   The patient understands to call the radiology department if there is: - increase in pain or swelling - changed or altered sensation - ulceration or blistering - increasing redness - warmth or increasing firmness - decreased tissue perfusion as noted by decreased capillary refill or discoloration of skin - decreased pulses peripheral to site   [facility] S Jenevieve Kirschbaum PA-C 02/26/2021 11:21 AM

## 2021-02-26 NOTE — Op Note (Signed)
[facility] Patient Name: Erin Kaufman Procedure Date : 02/26/2021 MRN: [id number] Attending MD: Carlota Raspberry. Castella Lerner , MD Date of Birth: 05/08/1970 CSN: [id number] Age: 50 Admit Type: Inpatient Procedure:                Colonoscopy Indications:              Rectal bleeding, Treatment of bleeding from                            polypectomy site - multiple colon polyps removed                            yesterday (10 cold, 3 hot) - right and left colon,                            s/p 4 units RBC with improved hemodynamics. CTA                            negative for active extravasation Providers:                Remo Lipps P. Havery Moros, MD, Benetta Spar,                            Technician, Vista Lawman, RN Referring MD:              Medicines:                Monitored Anesthesia Care Complications:            No immediate complications. Estimated blood loss:                            Minimal. Estimated Blood Loss:     Estimated blood loss was minimal. Procedure:                Pre-Anesthesia Assessment:                           - Prior to the procedure, a History and Physical                            was performed, and patient medications and                            allergies were reviewed. The patient's tolerance of                            previous anesthesia was also reviewed. The risks                            and benefits of the procedure and the sedation                            options and risks were discussed with the patient.  All questions were answered, and informed consent                            was obtained. Prior Anticoagulants: The patient has                            taken no previous anticoagulant or antiplatelet                            agents. ASA Grade Assessment: II - A patient with                            mild systemic disease. After reviewing the risks                            and benefits,  the patient was deemed in                            satisfactory condition to undergo the procedure.                           After obtaining informed consent, the colonoscope                            was passed under direct vision. Throughout the                            procedure, the patient's blood pressure, pulse, and                            oxygen saturations were monitored continuously. The                            [id number]-HQ190L ([id number] Olympus coloscope was                            introduced through the anus and advanced to the the                            cecum, identified by appendiceal orifice and                            ileocecal valve. The colonoscopy was performed                            without difficulty. The patient tolerated the                            procedure well. The quality of the bowel                            preparation was fair. The ileocecal valve,  appendiceal orifice, and rectum were photographed. Scope In: 9:40:45 AM Scope Out: 11:01:04 AM Scope Withdrawal Time: 1 hour 3 minutes 27 seconds  Total Procedure Duration: 1 hour 20 minutes 19 seconds  Findings:      The perianal and digital rectal examinations were normal.      Red blood and clotted blood was found in the entire colon. It was quite       tedious to lavage the clots and red blood to obtain visualization       initially.      Six postpolypectomy ulcers with protuberant adherent clot were found in       the sigmoid colon (2 - smaller in size), in the ascending colon (3) and       in the cecum (1). Largest site was in the cecum. Red blood was noted       throughout the entire colon. Following lavage of each site none was       actively bleeding so hard to say which was the culprit lesion but I       suspect one of the right colon lesions was responsible for the bleeding       given distribution of fresh blood in the entire colon. For hemostasis,        sixteen hemostatic clips were successfully placed (4 in cecal lesion, 9       amongst ascending colon lesions, and 3 in the sigmoid lesions, to close       the polypectomy defects. There was no bleeding at the end of the       procedure following extensive lavage and relook. Other small polypectomy       sites with clean based were noted and not treated.      Many medium-mouthed diverticula were found in the left colon.      The exam was otherwise without abnormality. Impression:               - A significant amount of fresh blood and clots in                            the entire examined colon.                           - Postpolypectomy ulcers as described above.                            Treated with hemostasis clips. Suspect one of the                            right sided polypectomy sites was more than likely                            responsible for her bleeding as above.                           - Diverticulosis in the left colon.                           - The examination was otherwise normal. Recommendation:           - Return patient to hospital ward for ongoing  care.                           - Clear liquid diet.                           - Continue present medications.                           - Monitor post transfusion Hgb, monitor for                            rebleeding Procedure Code(s):        --- Professional ---                           [phone], Colonoscopy, flexible; with control of                            bleeding, any method Diagnosis Code(s):        --- Professional ---                           K92.2, Gastrointestinal hemorrhage, unspecified                           K63.3, Ulcer of intestine                           K62.5, Hemorrhage of anus and rectum                           K91.840, Postprocedural hemorrhage of a digestive                            system organ or structure following a digestive                            system procedure                            K57.30, Diverticulosis of large intestine without                            perforation or abscess without bleeding CPT copyright 2019 [employer]. All rights reserved. The codes documented in this report are preliminary and upon coder review may  be revised to meet current compliance requirements. Remo Lipps P. Mirtie Bastyr, MD 02/26/2021 11:20:22 AM This report has been signed electronically. Number of Addenda: 0

## 2021-02-26 NOTE — ED Notes (Signed)
Pt transported to endo.  

## 2021-02-26 NOTE — H&P (Signed)
NAME:  Erin Kaufman, MRN:  [id number], DOB:  1970/08/28, LOS: 0 ADMISSION DATE:  02/26/2021, CONSULTATION DATE:  02/26/21 REFERRING MD:  Johnney Killian - EM, CHIEF COMPLAINT:  syncope -- hemorrhagic shock  History of Present Illness:  50 yo F  hx diverticulosis, colon polyps, HTN, HLD who underwent colonoscopy 12/16 with polypectomy x 13 presents to ED 12/17 with rectal bleeding, syncope. Experienced mild bleeding when she got home from procedure, which increased to BRBPR this morning in a large amount, and had a syncopal episode.  In ED found to be hypotensive. Rapidly transfused a total of 4 units with improvement in SBPs to 100.  CTA a/p performed which revealed a large amount of blood products in distal descending colon, sigmoid colon, rectum without obvious extrav.   Hgb in ED 10. Last documented prior is in 2021 when hgb 17. Does not take blood thinners. Does take daily baby ASA.   Patient being taken to endo for urgent EGD.   CCM consulted for admission   Pertinent  Medical History  Diverticulosis GERD HTN  Significant Hospital Events: Including procedures, antibiotic start and stop dates in addition to other pertinent events   12/16 colonoscopy with polypectomy x 13  12/17 large volume GIB + syncope. In ED hypotensive. 4 PRBC. To endo for EGD. Admit to ICU   Interim History / Subjective:  S/p EGD   Hemodynamics have improved   Objective   Blood pressure 114/76, pulse 88, temperature 99 F (37.2 C), temperature source Temporal, resp. rate 16, SpO2 100 %.        Intake/Output Summary (Last 24 hours) at 02/26/2021 0950 Last data filed at 02/26/2021 8341 Gross per 24 hour  Intake 50 ml  Output --  Net 50 ml   There were no vitals filed for this visit.  Examination: General: wdwn middle aged F reclined in bed NAD HENT: NCAT anicteric sclera  Lungs: CTAb even unlabored on RA  Cardiovascular: rrr cap refill brisk  Abdomen: soft ndnt + bowel sounds Extremities: no  acute deformity no cyanosis or clubbing  Neuro: drowsy Ox3  GU: wnl  Resolved Hospital Problem list     Assessment & Plan:    Hemorrhagic shock  - improving  GIB from bleeding post-polypectomy ulcer -underwent colonoscopy with polypectimy x 13 12/16  -s/p 4 PRBC in ED  -CTA without active extrav. Large volume blood in colon. EM d/w IR who rec tagged RBC.  P -EGD 12/17 -serial H/H -transfuse as needed  -clear liquid diet  -will admit to ICU for close monitoring -- hopefully will continue to stabilize   Hypokalemia Hypocalcemia P -received Ca in ED  -Replace K  -AM BMP  Hx HTN -hold in setting of resolving shock     Best Practice (right click and "Reselect all SmartList Selections" daily)   Diet/type: clear liquids DVT prophylaxis: SCD GI prophylaxis: N/A Lines: N/A Foley:  N/A Code Status:  full code Last date of multidisciplinary goals of care discussion [pending]  Labs   CBC: Recent Labs  Lab 02/26/21 0654  WBC 7.6  NEUTROABS 5.9  HGB 10.1*  HCT 30.6*  MCV 99.4  PLT 962    Basic Metabolic Panel: Recent Labs  Lab 02/26/21 0654  NA 133*  K 3.2*  CL 103  CO2 17*  GLUCOSE 123*  BUN 7  CREATININE 0.95  CALCIUM 7.2*   GFR: Estimated Creatinine Clearance: 63.7 mL/min (by C-G formula based on SCr of 0.95 mg/dL). Recent Labs  Lab  02/26/21 0654  WBC 7.6    Liver Function Tests: Recent Labs  Lab 02/26/21 0654  AST 18  ALT 17  ALKPHOS 41  BILITOT 0.9  PROT 4.5*  ALBUMIN 2.6*   No results for input(s): LIPASE, AMYLASE in the last 168 hours. No results for input(s): AMMONIA in the last 168 hours.  ABG No results found for: PHART, PCO2ART, PO2ART, HCO3, TCO2, ACIDBASEDEF, O2SAT   Coagulation Profile: Recent Labs  Lab 02/26/21 0710  INR 1.2    Cardiac Enzymes: No results for input(s): CKTOTAL, CKMB, CKMBINDEX, TROPONINI in the last 168 hours.  HbA1C: No results found for: HGBA1C  CBG: No results for input(s): GLUCAP in the  last 168 hours.  Review of Systems:   Review of Systems  Constitutional: Negative.   HENT: Negative.    Eyes: Negative.   Respiratory: Negative.    Cardiovascular: Negative.   Gastrointestinal:  Positive for blood in stool.  Genitourinary: Negative.   Musculoskeletal: Negative.   Skin: Negative.   Neurological:  Positive for dizziness and loss of consciousness.  Endo/Heme/Allergies: Negative.   Psychiatric/Behavioral: Negative.      Past Medical History:  She,  has a past medical history of Anxiety, GERD (gastroesophageal reflux disease), Hiatal hernia, Hyperlipidemia, Hypertension, and Thyroid disease.   Surgical History:   Past Surgical History:  Procedure Laterality Date   FINGER SURGERY Left 2019   3rd and 4th metatarsal fracture   UPPER GASTROINTESTINAL ENDOSCOPY  2010   Dr. Garnette Gunner   VAGINAL HYSTERECTOMY  2007   [place] History:   reports that she has been smoking cigarettes. She has a 25.00 pack-year smoking history. She has never used smokeless tobacco. She reports current alcohol use of about 4.0 - 6.0 standard drinks per week. She reports that she does not use drugs.   Family History:  Her family history includes Brain cancer in her mother; Diabetes in her mother; Heart disease in her mother; Hypertension in her mother; Lymphoma in her mother; Prostate cancer in her father. There is no history of Colon cancer, Colon polyps, Esophageal cancer, Rectal cancer, or Stomach cancer.   Allergies Allergies  Allergen Reactions   Penicillins Swelling and Rash    All over the body     Home Medications  Prior to Admission medications   Medication Sig Start Date End Date Taking? Authorizing Provider  Ascorbic Acid (VITAMIN C PO) Take 2 tablets by mouth daily as needed. During winter months    [provider]  aspirin EC 81 MG tablet Take 1 tablet (81 mg total) by mouth daily. 12/04/16   [name], MD  cholecalciferol  (VITAMIN D) 1000 units tablet Take 1,000 Units by mouth daily.    [provider]  hydrochlorothiazide (MICROZIDE) 12.5 MG capsule TAKE 1 CAPSULE BY MOUTH EVERY DAY 01/10/21   [name], MD  lisinopril (ZESTRIL) 10 MG tablet TAKE 1 TABLET BY MOUTH EVERY DAY 12/30/20   [name], MD  NEXIUM 40 MG capsule TAKE 1 CAPSULE (40 MG TOTAL) BY MOUTH DAILY. 03/29/13   Irene Shipper, MD  rosuvastatin (CRESTOR) 20 MG tablet TAKE 1 TABLET BY MOUTH EVERY DAY 03/18/20   [name], MD  SYNTHROID 125 MCG tablet Take 1 tablet (125 mcg total) by mouth daily. 11/27/12   Armandina Gemma, MD     Critical care time: n/a      Eliseo Gum MSN, AGACNP-BC [place]  Amion for pager 02/26/2021, 2:22 PM

## 2021-02-26 NOTE — ED Notes (Signed)
Blood being administered via [place]

## 2021-02-26 NOTE — ED Provider Notes (Signed)
[facility] EMERGENCY DEPARTMENT Provider Note   CSN: [id number] Arrival date & time: 02/26/21  6387     History No chief complaint on file.   Erin Kaufman is a 50 y.o. female.  HPI Patient had a screening colonoscopy yesterday with polyps removed.  This is done by Dr. Scarlette Shorts of [facility] gastroenterology.  Patient reports she started bleeding yesterday.  At 8 PM in the evening there was some small amount of red bleeding.  In the middle of the night around 3 AM there was some more bleeding with a few clots.  This morning the patient went into the bathroom and there were clots in the bed and large volume of clots passed in the bathroom. She became very weak and lightheaded. She had an episode of passing out.    Upon EMS arrival, patient had 2 more witnessed syncopal episodes.  They got a first blood pressure of 80/40 and a second pressure of 40/20.  They infused a liter of normal saline and proceeded to the ED.    Past Medical History:  Diagnosis Date   Anxiety    GERD (gastroesophageal reflux disease)    on meds   Hiatal hernia    Hyperlipidemia    on meds   Hypertension    on meds   Thyroid disease    on meds    Patient Active Problem List   Diagnosis Date Noted   Thoracic aortic aneurysm without rupture 06/18/2017   Essential (primary) hypertension 06/17/2017   Hyperlipidemia 06/17/2017   Tobacco abuse 06/17/2017   Routine general medical examination at a health care facility 08/22/2013   Multinodular goiter (nontoxic) 10/16/2011   HIATAL HERNIA 06/11/2008   ANXIETY 05/14/2008   HYPOTHYROIDISM 05/01/2008   GERD 05/01/2008   COLITIS 05/01/2008   RECTAL BLEEDING 05/01/2008   NAUSEA AND VOMITING 05/01/2008   DIARRHEA 05/01/2008   EPIGASTRIC PAIN 05/01/2008    Past Surgical History:  Procedure Laterality Date   FINGER SURGERY Left 2019   3rd and 4th metatarsal fracture   UPPER GASTROINTESTINAL ENDOSCOPY  2010   Dr. Garnette Gunner   VAGINAL  HYSTERECTOMY  2007   [place]     OB History   No obstetric history on file.     Family History  Problem Relation Age of Onset   Brain cancer Mother        tumor - did not specify type   Lymphoma Mother    Diabetes Mother    Hypertension Mother    Heart disease Mother    Prostate cancer Father    Colon cancer Neg Hx    Colon polyps Neg Hx    Esophageal cancer Neg Hx    Rectal cancer Neg Hx    Stomach cancer Neg Hx     Social History   Tobacco Use   Smoking status: Every Day    Packs/day: 1.00    Years: 25.00    Pack years: 25.00    Types: Cigarettes   Smokeless tobacco: Never   Tobacco comments:    form given 03-20-12  Vaping Use   Vaping Use: Never used  Substance Use Topics   Alcohol use: Yes    Alcohol/week: 4.0 - 6.0 standard drinks    Types: 4 - 6 Standard drinks or equivalent per week    Comment: socially   Drug use: Never    Home Medications Prior to Admission medications   Medication Sig Start Date End Date  Taking? Authorizing Provider  Ascorbic Acid (VITAMIN C PO) Take 2 tablets by mouth daily as needed. During winter months    [provider]  aspirin EC 81 MG tablet Take 1 tablet (81 mg total) by mouth daily. 12/04/16   [name], MD  cholecalciferol (VITAMIN D) 1000 units tablet Take 1,000 Units by mouth daily.    [provider]  hydrochlorothiazide (MICROZIDE) 12.5 MG capsule TAKE 1 CAPSULE BY MOUTH EVERY DAY 01/10/21   [name], MD  lisinopril (ZESTRIL) 10 MG tablet TAKE 1 TABLET BY MOUTH EVERY DAY 12/30/20   [name], MD  NEXIUM 40 MG capsule TAKE 1 CAPSULE (40 MG TOTAL) BY MOUTH DAILY. 03/29/13   Irene Shipper, MD  rosuvastatin (CRESTOR) 20 MG tablet TAKE 1 TABLET BY MOUTH EVERY DAY 03/18/20   [name], MD  SYNTHROID 125 MCG tablet Take 1 tablet (125 mcg total) by mouth daily. 11/27/12   Armandina Gemma, MD    Allergies    Penicillins  Review of Systems   Review of Systems 10 systems  reviewed and negative except as per HPI Physical Exam Updated Vital Signs BP 90/66    Pulse 93    Temp 98.4 F (36.9 C) (Oral)    Resp 20    SpO2 100%   Physical Exam Constitutional:      Comments: Patient is awake and answering questions.  She is uncomfortable and distressed in appearance.  She does not exhibit respiratory distress but is anxious and feels short of breath.  HENT:     Head: Normocephalic and atraumatic.     Mouth/Throat:     Pharynx: Oropharynx is clear.  Eyes:     Extraocular Movements: Extraocular movements intact.  Cardiovascular:     Rate and Rhythm: Normal rate and regular rhythm.  Pulmonary:     Effort: Pulmonary effort is normal.     Breath sounds: Normal breath sounds.  Abdominal:     General: There is no distension.     Palpations: Abdomen is soft.     Tenderness: There is no abdominal tenderness. There is no guarding.  Genitourinary:    Comments: Bright red blood in the rectum.  Blood dried over the buttocks legs and feet.  No active bleeding from the rectum at this time. Musculoskeletal:     Right lower leg: No edema.     Left lower leg: No edema.  Skin:    General: Skin is warm and dry.  Neurological:     Comments: Patient is awake and answering questions.  Mildly anxious and confused.  No focal motor deficits.    ED Results / Procedures / Treatments   Labs (all labs ordered are listed, but only abnormal results are displayed) Labs Reviewed  COMPREHENSIVE METABOLIC PANEL - Abnormal; Notable for the following components:      Result Value   Sodium 133 (*)    Potassium 3.2 (*)    CO2 17 (*)    Glucose, Bld 123 (*)    Calcium 7.2 (*)    Total Protein 4.5 (*)    Albumin 2.6 (*)    All other components within normal limits  CBC WITH DIFFERENTIAL/PLATELET - Abnormal; Notable for the following components:   RBC 3.08 (*)    Hemoglobin 10.1 (*)    HCT 30.6 (*)    All other components within normal limits  RESP PANEL BY RT-PCR (FLU A&B, COVID)  ARPGX2  PROTIME-INR  I-STAT BETA HCG BLOOD,  ED (MC, [facility], AP ONLY)  TYPE AND SCREEN  ABO/RH    EKG EKG Interpretation  Date/Time:  Saturday February 26 2021 07:13:32 EST Ventricular Rate:  90 PR Interval:  147 QRS Duration: 85 QT Interval:  412 QTC Calculation: 505 R Axis:   72 Text Interpretation: Sinus rhythm no sig interval change from first previous Confirmed by Charlesetta Shanks [id number]) on 02/26/2021 7:39:41 AM  Radiology No results found.  Procedures Procedures  CRITICAL CARE Performed by: Charlesetta Shanks   Total critical care time: 60 minutes  Critical care time was exclusive of separately billable procedures and treating other patients.  Critical care was necessary to treat or prevent imminent or life-threatening deterioration.  Critical care was time spent personally by me on the following activities: development of treatment plan with patient and/or surrogate as well as nursing, discussions with consultants, evaluation of patient's response to treatment, examination of patient, obtaining history from patient or surrogate, ordering and performing treatments and interventions, ordering and review of laboratory studies, ordering and review of radiographic studies, pulse oximetry and re-evaluation of patient's condition.  Medications Ordered in ED Medications  0.9 %  sodium chloride infusion (Manually program via Guardrails IV Fluids) (has no administration in time range)  iohexol (OMNIPAQUE) 350 MG/ML injection 100 mL (has no administration in time range)  calcium gluconate in NaCl 1-0.675 GM/50ML-% IVPB (0 g  Stopped 02/26/21 4128)    ED Course  I have reviewed the triage vital signs and the nursing notes.  Pertinent labs & imaging results that were available during my care of the patient were reviewed by me and considered in my medical decision making (see chart for details).  Clinical Course as of 02/26/21 7867  Sat Feb 26, 2021  0716 Page is placed to  interventional radiology at 7: 45.  Spoke with [facility] and currently no IR radiologist in the facility, arrival time 8 AM others on-call contacted at this time Dr. Pascal Lux and Dr. Serafina Royals by pager. [MP]  [phone] Consult: Dr. Ardis Hughs of our GI will be in to see the patient. Consult: Dr. Pascal Lux interventional radiology requests stabilization and CT angiogram. [MP]  [phone] CT scan has been completed patient is being returned to room.  Blood pressure at this time is 962 systolic [MP]  8366 Reviewed results with Dr. Pascal Lux.  A lot of blood in the colon but no active bleeding source at this time.  Advises patient will need tagged RBC study.  As far as extravasation from contrast in the left upper extremity, if minimally or mildly symptomatic no specific treatment.  They will continue to observe for any significant changes needing more aggressive treatment. [MP]  [id number] Left upper arm examined for contrast extravasation.  Patient reports initially it was very painful like her arm was blowing up but now there is no pain.  The arm is soft and pliable.  No palpable induration at this time. [MP]    Clinical Course User Index [MP] Charlesetta Shanks, MD   MDM Rules/Calculators/A&P                         Patient presents after colonoscopy with polypectomy yesterday.  She had GI bleeding overnight as outlined.  Patient was hypotensive upon EMS arrival with a drop in pressures to 29U systolic.  1 L of normal saline infused.  On arrival to the emergency department, systolic pressures in the 60s and very anxious with air hunger consistent with acute blood loss.  Patient given 2 units of O- blood by rapid infusion.  Blood pressures improved to 79K systolic and she felt some improvement.  However in very short order, pressures again dropped into the 32X systolic.  2 more units of blood rapid infused.  During this time consultations were placed to interventional radiology and gastroenterology.  Plan with consultation with Dr.  Pascal Lux for interventional radiology is for CT angiogram.  After fourth unit infused patient's blood pressures are at 614 systolic and she is clinically improved.  At this time we will proceed with CTA with monitoring and rapid infuser at bedside.  Dr. Ardis Hughs of gastroenterology consulted and will be evaluated the patient in the emergency department.  Patient's blood pressure remained stable at 709 systolic after CT scan.  Repeat blood administration was not required.  GI making arrangements for colonoscopy to try to identify bleeding source.  CT angio showed extensive volume of blood in the colon but no extravasation.  Intensivist team has been consulted for admission.   Final Clinical Impression(s) / ED Diagnoses Final diagnoses:  Syncope and collapse    Rx / DC Orders ED Discharge Orders     None        Charlesetta Shanks, MD 02/26/21 [phone]

## 2021-02-26 NOTE — ED Notes (Signed)
All belongings are with husband

## 2021-02-26 NOTE — Anesthesia Preprocedure Evaluation (Signed)
Anesthesia Evaluation  Patient identified by MRN, date of birth, ID band Patient awake    Reviewed: Allergy & Precautions, H&P , NPO status , Patient's Chart, lab work & pertinent test results  Airway Mallampati: II   Neck ROM: full    Dental   Pulmonary Current Smoker,    breath sounds clear to auscultation       Cardiovascular hypertension,  Rhythm:regular Rate:Normal     Neuro/Psych PSYCHIATRIC DISORDERS Anxiety  Neuromuscular disease    GI/Hepatic hiatal hernia, GERD  ,S/p colonoscopy and polypectomy recently   Endo/Other  Hypothyroidism   Renal/GU      Musculoskeletal   Abdominal   Peds  Hematology   Anesthesia Other Findings   Reproductive/Obstetrics                             Anesthesia Physical Anesthesia Plan  ASA: 2  Anesthesia Plan: MAC   Post-op Pain Management:    Induction: Intravenous  PONV Risk Score and Plan: 1 and Propofol infusion and Treatment may vary due to age or medical condition  Airway Management Planned: Nasal Cannula  Additional Equipment:   Intra-op Plan:   Post-operative Plan:   Informed Consent: I have reviewed the patients History and Physical, chart, labs and discussed the procedure including the risks, benefits and alternatives for the proposed anesthesia with the patient or authorized representative who has indicated his/her understanding and acceptance.     Dental advisory given  Plan Discussed with: CRNA, Anesthesiologist and Surgeon  Anesthesia Plan Comments:         Anesthesia Quick Evaluation

## 2021-02-27 ENCOUNTER — Encounter (HOSPITAL_COMMUNITY): Payer: Self-pay | Admitting: Gastroenterology

## 2021-02-27 LAB — HEMOGLOBIN AND HEMATOCRIT, BLOOD
HCT: 33.6 % — ABNORMAL LOW (ref 36.0–46.0)
Hemoglobin: 12 g/dL (ref 12.0–15.0)

## 2021-02-27 LAB — BASIC METABOLIC PANEL
Anion gap: 3 — ABNORMAL LOW (ref 5–15)
BUN: <5 mg/dL — ABNORMAL LOW (ref 6–20)
CO2: 25 mmol/L (ref 22–32)
Calcium: 8.1 mg/dL — ABNORMAL LOW (ref 8.9–10.3)
Chloride: 107 mmol/L (ref 98–111)
Creatinine, Ser: 0.59 mg/dL (ref 0.44–1.00)
GFR, Estimated: >60 mL/min (ref >60)
Glucose, Bld: 114 mg/dL — ABNORMAL HIGH (ref 70–99)
Potassium: 3.5 mmol/L (ref 3.5–5.1)
Sodium: 135 mmol/L (ref 135–145)

## 2021-02-27 MED ORDER — PANTOPRAZOLE SODIUM 40 MG PO TBEC
40.0000 mg | DELAYED_RELEASE_TABLET | Freq: Two times a day (BID) | ORAL | Status: DC
  Administered 2021-02-27: 10:00:00 40 mg via ORAL
  Filled 2021-02-27: qty 1

## 2021-02-27 MED ORDER — PANTOPRAZOLE SODIUM 40 MG PO TBEC
40.0000 mg | DELAYED_RELEASE_TABLET | Freq: Every day | ORAL | Status: DC
  Administered 2021-02-28: 10:00:00 40 mg via ORAL
  Filled 2021-02-27: qty 1

## 2021-02-27 MED ORDER — POTASSIUM CHLORIDE CRYS ER 20 MEQ PO TBCR
40.0000 meq | EXTENDED_RELEASE_TABLET | Freq: Once | ORAL | Status: AC
  Administered 2021-02-27: 07:00:00 40 meq via ORAL
  Filled 2021-02-27: qty 2

## 2021-02-27 MED ORDER — LEVOTHYROXINE SODIUM 25 MCG PO TABS
125.0000 ug | ORAL_TABLET | Freq: Every day | ORAL | Status: DC
  Administered 2021-02-27 – 2021-02-28 (×2): 125 ug via ORAL
  Filled 2021-02-27 (×2): qty 1

## 2021-02-27 NOTE — Progress Notes (Signed)
NAME:  Erin Kaufman, MRN:  [id number], DOB:  Oct 28, 1970, LOS: 1 ADMISSION DATE:  02/26/2021, CONSULTATION DATE:  02/26/21 REFERRING MD:  Johnney Killian - EM, CHIEF COMPLAINT:  syncope -- hemorrhagic shock  History of Present Illness:  50 yo F  hx diverticulosis, colon polyps, HTN, HLD who underwent colonoscopy 12/16 with polypectomy x 13 presents to ED 12/17 with rectal bleeding, syncope. Experienced mild bleeding when she got home from procedure, which increased to BRBPR this morning in a large amount, and had a syncopal episode.  In ED found to be hypotensive. Rapidly transfused a total of 4 units with improvement in SBPs to 100.  CTA a/p performed which revealed a large amount of blood products in distal descending colon, sigmoid colon, rectum without obvious extrav.   Hgb in ED 10. Last documented prior is in 2021 when hgb 17. Does not take blood thinners. Does take daily baby ASA.   Patient being taken to endo for urgent EGD.   CCM consulted for admission   Pertinent  Medical History  Diverticulosis GERD HTN  Significant Hospital Events: Including procedures, antibiotic start and stop dates in addition to other pertinent events   12/16 colonoscopy with polypectomy x 13  12/17 large volume GIB + syncope. In ED hypotensive. 4 PRBC. To endo for Colonoscopy with 16 clips  Interim History / Subjective:  No overnight issues, remained stable   Objective   Blood pressure 109/61, pulse 73, temperature 98.9 F (37.2 C), temperature source Axillary, resp. rate (!) 21, SpO2 98 %.        Intake/Output Summary (Last 24 hours) at 02/27/2021 1018 Last data filed at 02/27/2021 0200 Gross per 24 hour  Intake 1618.39 ml  Output --  Net 1618.39 ml    There were no vitals filed for this visit.  Examination: General: Acutely ill-appearing female, lying on the bed HEENT: [place]/AT, eyes anicteric.  Off nasal cannula, may be explaining management menses for Neuro: Alert, awake following  commands Chest: Coarse breath sounds, no wheezes or rhonchi Heart: Regular rate and rhythm, no murmurs or gallops Abdomen: Soft, nontender, nondistended, bowel sounds present Skin: No rash   Resolved Hospital Problem list   Hemorrhagic shock Hyponatremia  Assessment & Plan:  Acute blood loss anemia s/p 4 units transfusion Hypokalemia Hypocalcemia Hypertension  Patient received 4 units of PRBCs, now H&H remained stable No more hematemesis, hematochezia or melena GI is following Resume Protonix Patient underwent colonoscopy yesterday with 16 clips at the polypectomy sites Still on clear liquid diet, awaiting GI follow-up Continue aggressive electrolyte supplement Holding antihypertensive meds   Best Practice (right click and "Reselect all SmartList Selections" daily)   Diet/type: clear liquids DVT prophylaxis: SCD GI prophylaxis: N/A Lines: N/A Foley:  N/A Code Status:  full code Last date of multidisciplinary goals of care discussion [12/18: Patient and her husband were updated at bedside, decision was to continue aggressive care]  Labs   CBC: Recent Labs  Lab 02/26/21 0654 02/26/21 1157 02/26/21 1226 02/26/21 2011 02/27/21 0352  WBC 7.6  --   --   --   --   NEUTROABS 5.9  --   --   --   --   HGB 10.1* 13.7 13.8 12.4 12.0  HCT 30.6* 40.0 39.7 35.2* 33.6*  MCV 99.4  --   --   --   --   PLT 207  --  146*  --   --      Basic Metabolic Panel: Recent Labs  Lab 02/26/21 0654 02/27/21 0352  NA 133* 135  K 3.2* 3.5  CL 103 107  CO2 17* 25  GLUCOSE 123* 114*  BUN 7 <5*  CREATININE 0.95 0.59  CALCIUM 7.2* 8.1*    GFR: Estimated Creatinine Clearance: 75.6 mL/min (by C-G formula based on SCr of 0.59 mg/dL). Recent Labs  Lab 02/26/21 0654  WBC 7.6     Liver Function Tests: Recent Labs  Lab 02/26/21 0654  AST 18  ALT 17  ALKPHOS 41  BILITOT 0.9  PROT 4.5*  ALBUMIN 2.6*    No results for input(s): LIPASE, AMYLASE in the last 168 hours. No  results for input(s): AMMONIA in the last 168 hours.  ABG No results found for: PHART, PCO2ART, PO2ART, HCO3, TCO2, ACIDBASEDEF, O2SAT   Coagulation Profile: Recent Labs  Lab 02/26/21 0710 02/26/21 1226  INR 1.2 1.2     Cardiac Enzymes: No results for input(s): CKTOTAL, CKMB, CKMBINDEX, TROPONINI in the last 168 hours.  HbA1C: No results found for: HGBA1C  CBG: No results for input(s): GLUCAP in the last 168 hours.     Jacky Kindle MD [facility] See Amion for pager If no response to pager, please call [phone] until 7pm After 7pm, Please call E-link [phone]

## 2021-02-27 NOTE — Progress Notes (Signed)
K+ 3.5  Replaced per protocol  

## 2021-02-27 NOTE — Progress Notes (Signed)
Daily Rounding Note  02/27/2021, 11:13 AM  LOS: 1 day   SUBJECTIVE:   Chief complaint:    Post polypectomy bleed.  Blood loss anemia.  Feels well.  Not dizzy when she walks around.  Passed a small amount of dark blood yesterday evening after the colonoscopy.  No abdominal pain.  Tolerating clears.  Intensivist advance diet to regular starting at lunch.  OBJECTIVE:         Vital signs in last 24 hours:    Temp:  [98.9 F (37.2 C)-99.5 F (37.5 C)] 98.9 F (37.2 C) (12/18 0800) Pulse Rate:  [69-104] 73 (12/18 0800) Resp:  [10-27] 21 (12/18 0800) BP: (98-137)/(57-109) 109/61 (12/18 0800) SpO2:  [95 %-100 %] 98 % (12/18 0800)   There were no vitals filed for this visit. General: Looks well.  Alert, gregarious.  Comfortable. Heart: RRR with rate in 80s. Chest: Clear bilaterally.  No labored breathing or cough Abdomen: Soft, not tender or distended.  No HSM, masses, bruits, hernias Extremities: No CCE. Neuro/Psych: Oriented x3.  Moves all 4 limbs without deficit.  Loquacious with fluid speech.  Intake/Output from previous day: 12/17 0701 - 12/18 0700 In: 1668.4 [P.O.:320; I.V.:1100; IV Piggyback:248.4] Out: -   Intake/Output this shift: No intake/output data recorded.  Lab Results: Recent Labs    02/26/21 0654 02/26/21 1157 02/26/21 1226 02/26/21 2011 02/27/21 0352  WBC 7.6  --   --   --   --   HGB 10.1*   < > 13.8 12.4 12.0  HCT 30.6*   < > 39.7 35.2* 33.6*  PLT 207  --  146*  --   --    < > = values in this interval not displayed.   BMET Recent Labs    02/26/21 0654 02/27/21 0352  NA 133* 135  K 3.2* 3.5  CL 103 107  CO2 17* 25  GLUCOSE 123* 114*  BUN 7 <5*  CREATININE 0.95 0.59  CALCIUM 7.2* 8.1*   LFT Recent Labs    02/26/21 0654  PROT 4.5*  ALBUMIN 2.6*  AST 18  ALT 17  ALKPHOS 41  BILITOT 0.9   PT/INR Recent Labs    02/26/21 0710 02/26/21 1226  LABPROT 15.1 14.9  INR 1.2  1.2   Hepatitis Panel No results for input(s): HEPBSAG, HCVAB, HEPAIGM, HEPBIGM in the last 72 hours.  Studies/Results: DG Chest Port 1 View  Result Date: 02/26/2021 CLINICAL DATA:  Pt bib [facility] from home with GI bleed. Had colonoscopy yesterday where she had 13 polyps removed. Woke up this morning to go to the bathroom and had a syncopal episodeHx of GERD, HTNSmoker EXAM: PORTABLE CHEST 1 VIEW COMPARISON:  CTA chest, 07/10/2017. FINDINGS: Normal heart, mediastinum and hila. Clear lungs.  No convincing pleural effusion and no pneumothorax. Skeletal structures are grossly intact. IMPRESSION: No active disease. Electronically Signed   By: Lajean Manes M.D.   On: 02/26/2021 08:45   CT Angio Abd/Pel w/ and/or w/o  Result Date: 02/26/2021 CLINICAL DATA:  Post polypectomy, now with lower GI bleeding. EXAM: CTA ABDOMEN AND PELVIS WITHOUT AND WITH CONTRAST TECHNIQUE: Multidetector CT imaging of the abdomen and pelvis was performed using the standard protocol during bolus administration of intravenous contrast. Multiplanar reconstructed images and MIPs were obtained and reviewed to evaluate the vascular anatomy. CONTRAST:  172mL OMNIPAQUE IOHEXOL 350 MG/ML SOLN COMPARISON:  None. FINDINGS: VASCULAR Aorta: Scattered mixed calcified and noncalcified atherosclerotic plaque within a  normal caliber abdominal aorta, not resulting in a hemodynamically significant stenosis. No evidence of abdominal aortic dissection or periaortic stranding. Celiac: Note is made of separate origins of the common hepatic and splenic arteries in lieu of a conventional celiac trunk. Both vessels appear widely patent without a hemodynamically significant narrowing. SMA: Widely patent without hemodynamically significant narrowing. Conventional branching pattern. The distal tributaries of the SMA appear widely patent without discrete intraluminal filling defect to suggest distal embolism. Renals: Solitary bilaterally; the bilateral  renal arteries are widely patent without a hemodynamically significant narrowing. No vessel irregularity to suggest FMD. IMA: Remains patent. Inflow: There is a very minimal amount of eccentric calcified atherosclerotic plaque involving the distal aspect the left common iliac artery, not resulting in hemodynamically significant stenosis. The bilateral common, external and internal iliac arteries are of normal caliber and widely patent without hemodynamically significant narrowing. Proximal Outflow: There is a minimal amount of noncalcified atherosclerotic plaque involving the bilateral common femoral arteries, not resulting in a hemodynamically significant stenosis. The imaged portions of the bilateral deep and superficial femoral arteries are of normal caliber and widely patent without a hemodynamically significant narrowing. Veins: The IVC and pelvic venous systems appear widely patent. Review of the MIP images confirms the above findings. _________________________________________________________ NON-VASCULAR Lower chest: Limited visualization of the lower thorax demonstrates minimal dependent subpleural ground-glass atelectasis. No discrete focal airspace opacities. No pleural effusion. Normal heart size.  No pericardial effusion. Hepatobiliary: Normal hepatic contour. There is diffuse decreased attenuation of the hepatic parenchyma suggestive of hepatic steatosis. No discrete hepatic lesions. Normal appearance of the gallbladder given degree distention. No intra or extrahepatic biliary duct dilatation. No ascites. Pancreas: Normal appearance of the pancreas. Spleen: Normal appearance of the spleen. Adrenals/Urinary Tract: There is symmetric enhancement of the bilateral kidneys. Subcentimeter hypoattenuating left-sided renal lesions are too small to accurately characterize though favored to represent additional renal cysts. No discrete worrisome renal lesions. Review of the noncontrast images is negative for the  presence of nephrolithiasis. There is a very minimal amount of likely body habitus related perinephric stranding. No urine obstruction. Normal appearance of the bilateral adrenal glands. Normal appearance of the urinary bladder given degree of distention. Stomach/Bowel: Large amount of high density debris is seen within the distal descending colon as well as the sigmoid colon and rectum compatible with provided history of recent GI bleeding. Despite suspected intraluminal blood, there are no discrete areas of intraluminal contrast extravasation to identify the etiology of the lower GI bleeding. Rather extensive colonic diverticulosis without evidence superimposed acute diverticulitis. Moderate colonic stool burden without evidence of enteric obstruction. Normal appearance of the terminal ileum and the retrocecal appendix. No pneumoperitoneum, pneumatosis or portal venous gas. Lymphatic: No bulky retroperitoneal, mesenteric, pelvic or inguinal lymphadenopathy. Reproductive: Post hysterectomy. No discrete adnexal lesions. No free fluid the pelvic cul-de-sac. Other: Tiny mesenteric fat containing periumbilical hernia. There is a minimal amount of subcutaneous edema about the midline of the low back. Musculoskeletal: No acute or aggressive osseous abnormalities. Bilateral L5 pars defects with associated grade 1 anterolisthesis of L5 upon S1 measuring approximately 5 mm. IMPRESSION: 1. Large amount of blood products primarily within the distal descending colon, sigmoid colon and rectum without discrete area of intraluminal contrast extravasation to suggest the etiology of the lower GI bleeding. 2. Scattered minimal amount of atherosclerotic plaque within a normal caliber abdominal aorta, not resulting in hemodynamically significant stenosis. Aortic Atherosclerosis (ICD10-I70.0). 3. Colonic diverticulosis without evidence superimposed acute diverticulitis. 4. Hepatic steatosis.  Correlation with  LFTs is advised. Critical  Value/emergent results were called by telephone at the time of interpretation on 02/26/2021 at 8:26 am to provider [facility] , who verbally acknowledged these results. PLAN: Above discussed with attending gastroenterologist, Dr. Havery Moros who will either consider repeat colonoscopy versus proceeding with tagged red blood cell study pending his evaluation of the patient's hemodynamic stability. Electronically Signed   By: Sandi Mariscal M.D.   On: 02/26/2021 09:00    Scheduled Meds:  sodium chloride   Intravenous Once   chlorhexidine  15 mL Mouth Rinse BID   Chlorhexidine Gluconate Cloth  6 each Topical Daily   levothyroxine  125 mcg Oral Q0600   [START ON 02/28/2021] pantoprazole  40 mg Oral Daily   Continuous Infusions: PRN Meds:.docusate sodium, ondansetron (ZOFRAN) IV, polyethylene glycol   ASSESMENT:   Post polypectomy bleed.  02/25/2021 colonoscopy with multiple polypectomies. 02/26/2021 CTAP with angio with blood products retained in distal descending, sigmoid, rectum but no active extravasation to pinpoint source of bleeding. 02/26/2021 colonoscopy: Encountered fresh blood, clots of stool throughout the colon.  6 ulcers at post polypectomy sites with adherent clot.  The largest of these was at the cecum but also present in the ascending and sigmoid colon.  Unable to determine which ulcer was responsible for bleeding.  A total of 16 hemostatic clips were placed.  L colon tics Pathology from colon polyps is still pending.  Reports having taken daily aspirin up until 12/15  Blood loss anemia.  Hgb 10.1 .Marland Kitchen 12.  Transfused 4 PRBCs at arrival.     Fatty liver.  Other than albumin of 2.6, LFTs normal.   PLAN     Agree w regular diet.  ?  Was the aspirin she was taking, last dose was on Thursday, prescribed or was she taking this without doctors advice?  May want to reconsider use of aspirin and less indicated.  H&H in the morning.    Azucena Freed  02/27/2021, 11:13 AM Phone [phone]

## 2021-02-27 NOTE — Progress Notes (Signed)
°  Transition of Care [facility]) Screening Note   Patient Details  Name: Erin Kaufman Date of Birth: 01-25-1971   Transition of Care [facility]) CM/SW Contact:    Alfredia Ferguson, LCSW Phone Number: 02/27/2021, 12:10 PM    Transition of Care Department [facility]) has reviewed patient and no TOC needs have been identified at this time. We will continue to monitor patient advancement through interdisciplinary progression rounds. If new patient transition needs arise, please place a TOC consult.

## 2021-02-28 DIAGNOSIS — Z86010 Personal history of colonic polyps: Secondary | ICD-10-CM

## 2021-02-28 DIAGNOSIS — Z9889 Other specified postprocedural states: Secondary | ICD-10-CM

## 2021-02-28 DIAGNOSIS — K91840 Postprocedural hemorrhage and hematoma of a digestive system organ or structure following a digestive system procedure: Principal | ICD-10-CM

## 2021-02-28 LAB — HEMOGLOBIN AND HEMATOCRIT, BLOOD
HCT: 33.9 % — ABNORMAL LOW (ref 36.0–46.0)
HCT: 34.1 % — ABNORMAL LOW (ref 36.0–46.0)
Hemoglobin: 11.7 g/dL — ABNORMAL LOW (ref 12.0–15.0)
Hemoglobin: 11.8 g/dL — ABNORMAL LOW (ref 12.0–15.0)

## 2021-02-28 MED ORDER — ASPIRIN EC 81 MG PO TBEC: 81.0000 mg | DELAYED_RELEASE_TABLET | Freq: Every day | ORAL | 3 refills | Status: DC

## 2021-02-28 NOTE — Progress Notes (Signed)
Mobility Specialist Progress Note:   02/28/21 1000  Mobility  Activity Ambulated in room  Level of Assistance Independent  Assistive Device None  Minutes Ambulated 5 minutes  Mobility Ambulated independently in room  Mobility Response Tolerated well  Mobility performed by Mobility specialist  Bed Position Chair  $Mobility charge 1 Mobility   Pt voiced minor little toe pain, which she thinks she injured when she passed out. Otherwise asx during ambulation, eager for d/c.  Nelta Numbers Mobility Specialist  Phone [phone]

## 2021-02-28 NOTE — Anesthesia Postprocedure Evaluation (Signed)
Anesthesia Post Note  Patient: Erin Kaufman  Procedure(s) Performed: COLONOSCOPY WITH PROPOFOL HEMOSTASIS CLIP PLACEMENT     Patient location during evaluation: Endoscopy Anesthesia Type: MAC Level of consciousness: awake and alert Pain management: pain level controlled Vital Signs Assessment: post-procedure vital signs reviewed and stable Respiratory status: spontaneous breathing, nonlabored ventilation, respiratory function stable and patient connected to nasal cannula oxygen Cardiovascular status: stable and blood pressure returned to baseline Postop Assessment: no apparent nausea or vomiting Anesthetic complications: no   No notable events documented.  Last Vitals:  Vitals:   02/28/21 0508 02/28/21 0746  BP: 115/68 125/82  Pulse: 73 74  Resp:  18  Temp: 36.8 C 36.9 C  SpO2: 100% 97%    Last Pain:  Vitals:   02/28/21 0746  TempSrc: Oral  PainSc:                  Hood S

## 2021-02-28 NOTE — Discharge Summary (Signed)
Physician Discharge Summary   Patient ID: Erin Kaufman MRN: [id number] DOB/AGE: [birth year] 49 y.o.  Admit date: 02/26/2021 Discharge date: 02/28/2021                     Discharge Plan by Diagnosis   Postpolypectomy bleeding - s/p re-look colonoscopy with clipping x 16. - Avoid strenuous activity / heavy lifting for a few days. - Hold home ASA for another 4 - 5 days then can resume.  Colonic Polyps - s/p polypectomy x 13 12/16. - f/u on Pathology.  Hx HTN, HLD. - Continue home HCTZ, Lisinopril, Rosuvastatin.  Hx GERD. - Continue home Rosuvastatin.  Hx Hypothyroidism. - Continue home Synthroid.   Discharge Summary  Erin Kaufman is a 50 y.o. y/o female with a PMH of diverticulosis, colon polyps, HTN, HLD who underwent outpatient colonoscopy 12/16 with polypectomy x 13 and then presents to ED 12/17 with rectal bleeding and syncope. No hx of antiplatelets or anticoagulation. Experienced mild bleeding when she got home from procedure, which increased to BRBPR this morning in a large amount, and had a syncopal episode.  In ED found to be hypotensive (per report, SBP as low as 40's on EMS arrival). Rapidly transfused a total of 4 units with improvement in SBPs to 100. CTA A/P performed which revealed a large amount of blood products in distal descending colon, sigmoid colon, rectum without obvious extravasation.    She was taken for urgent colonoscopy which showed blood products throughout the entire colon along with postpolypectomy ulcers which were treated with placement of 16 clips. She tolerated the procedure well and had no further bleeding.  On 12/19, she was deemed medically stable and was cleared for discharge home.         Significant Hospital Events   12/16 polypectomy x 13. 12/17 post polypectomy bleeding requiring admission and 4u PRBC. 12/18 transfer out of ICU. 12/19 discharge home.  Significant Diagnostic Studies  CTA A/P 12/17 > blood products in colon, no  obvious extravasation. Colonoscopy 12/17 > blood products throughout the entire colon along with postpolypectomy ulcers which were treated with placement of 16 clips.  Micro Data  COVID 12/17 > neg. Flu 12/17 > neg.  Antimicrobials  None.  Consults  GI.  Objective:  Blood pressure 125/82, pulse 74, temperature 98.4 F (36.9 C), temperature source Oral, resp. rate 18, SpO2 97 %.        Intake/Output Summary (Last 24 hours) at 02/28/2021 1021 Last data filed at 02/27/2021 1130 Gross per 24 hour  Intake 240 ml  Output --  Net 240 ml   There were no vitals filed for this visit.  Physical Examination: General: Adult female, sitting in chair, in NAD. Neuro: A&O x 3, non-focal.  HEENT: [place]/AT. EOMI, sclerae anicteric. Cardiovascular: RRR, no M/R/G.  Lungs: Respirations even and unlabored.  CTA bilaterally, No W/R/R. Abdomen: BS x 4, soft, NT/ND.  Musculoskeletal: No gross deformities, no edema.  Skin: Intact, warm, no rashes.  Discharge Labs:  BMET Recent Labs  Lab 02/26/21 0654 02/27/21 0352  NA 133* 135  K 3.2* 3.5  CL 103 107  CO2 17* 25  GLUCOSE 123* 114*  BUN 7 <5*  CREATININE 0.95 0.59  CALCIUM 7.2* 8.1*    CBC Recent Labs  Lab 02/26/21 0654 02/26/21 1157 02/26/21 1226 02/26/21 2011 02/27/21 0352 02/28/21 0042 02/28/21 0736  HGB 10.1*   < > 13.8   < > 12.0 11.7* 11.8*  HCT 30.6*   < >  39.7   < > 33.6* 33.9* 34.1*  WBC 7.6  --   --   --   --   --   --   PLT 207  --  146*  --   --   --   --    < > = values in this interval not displayed.    Anti-Coagulation Recent Labs  Lab 02/26/21 0710 02/26/21 1226  INR 1.2 1.2    Discharge Instructions     Diet - low sodium heart healthy   Complete by: As directed    Discharge instructions   Complete by: As directed    Avoid strenuous activity / heavy lifting. Hold baby aspirin for 4 - 5 days post discharge then can resume.   Increase activity slowly   Complete by: As directed         Allergies as of 02/28/2021       Reactions   Penicillins Swelling, Rash   All over the body        Medication List     TAKE these medications    aspirin EC 81 MG tablet Take 1 tablet (81 mg total) by mouth daily.   cholecalciferol 1000 units tablet Commonly known as: VITAMIN D Take 1,000 Units by mouth daily.   hydrochlorothiazide 12.5 MG capsule Commonly known as: MICROZIDE TAKE 1 CAPSULE BY MOUTH EVERY DAY What changed: how much to take   lisinopril 10 MG tablet Commonly known as: ZESTRIL TAKE 1 TABLET BY MOUTH EVERY DAY   NexIUM 40 MG capsule Generic drug: esomeprazole TAKE 1 CAPSULE (40 MG TOTAL) BY MOUTH DAILY. What changed:  how much to take how to take this   rosuvastatin 20 MG tablet Commonly known as: CRESTOR TAKE 1 TABLET BY MOUTH EVERY DAY   Synthroid 125 MCG tablet Generic drug: levothyroxine Take 1 tablet (125 mcg total) by mouth daily.   VITAMIN C PO Take 2 tablets by mouth daily as needed (For cold symptoms). During winter months        Disposition: Home   Discharge Condition:  Erin Kaufman has met maximum benefit of inpatient care and is medically stable and cleared for discharge.  Patient is pending follow up as above.     Time spent on discharge: Less than 30 minutes.   Montey Hora, [facility] For pager details, please see AMION If no response to pager, please call [phone] until 7:00 PM After 7:00 PM, please call Elink at [phone] 02/28/2021, 10:21 AM

## 2021-02-28 NOTE — Progress Notes (Signed)
° °   Progress Note   Subjective  Chief Complaint: Post polypectomy bleed  Patient doing well this morning, she tells me she really has not had a bowel movement over the past 24 hours but she just started eating solid food yesterday evening, she has a full plate of breakfast in front of her.  Does have some questions about going on vacation to [place] and snowmobiling etc., just wants to make sure she does not have any limitations.  Not leaving until December 27.   Objective   Vital signs in last 24 hours: Temp:  [98.3 F (36.8 C)-98.7 F (37.1 C)] 98.4 F (36.9 C) (12/19 0746) Pulse Rate:  [73-94] 74 (12/19 0746) Resp:  [18-21] 18 (12/19 0746) BP: (105-125)/(68-82) 125/82 (12/19 0746) SpO2:  [96 %-100 %] 97 % (12/19 0746)   General:    white female in NAD Heart:  Regular rate and rhythm; no murmurs Lungs: Respirations even and unlabored, lungs CTA bilaterally Abdomen:  Soft, nontender and nondistended. Normal bowel sounds. Psych:  Cooperative. Normal mood and affect.  Intake/Output from previous day: 12/18 0701 - 12/19 0700 In: 240 [P.O.:240] Out: -   Lab Results: Recent Labs    02/26/21 0654 02/26/21 1157 02/26/21 1226 02/26/21 2011 02/27/21 0352 02/28/21 0042 02/28/21 0736  WBC 7.6  --   --   --   --   --   --   HGB 10.1*   < > 13.8   < > 12.0 11.7* 11.8*  HCT 30.6*   < > 39.7   < > 33.6* 33.9* 34.1*  PLT 207  --  146*  --   --   --   --    < > = values in this interval not displayed.   BMET Recent Labs    02/26/21 0654 02/27/21 0352  NA 133* 135  K 3.2* 3.5  CL 103 107  CO2 17* 25  GLUCOSE 123* 114*  BUN 7 <5*  CREATININE 0.95 0.59  CALCIUM 7.2* 8.1*   LFT Recent Labs    02/26/21 0654  PROT 4.5*  ALBUMIN 2.6*  AST 18  ALT 17  ALKPHOS 41  BILITOT 0.9   PT/INR Recent Labs    02/26/21 0710 02/26/21 1226  LABPROT 15.1 14.9  INR 1.2 1.2     Assessment / Plan:   Assessment: 1.  Post polypectomy bleed: 02/25/2021 colonoscopy with multiple  polypectomies, 02/26/2021 CTAP with blood products retained in the distal descending, sigmoid and rectum but no active extravasation, colonoscopy that day encountered fresh blood, clots of stool throughout the colon and 6 ulcers at post polypectomy sites with adherent clots.  A total of 16 hemostatic clips were placed, hemoglobin stable overnight 2.  Blood loss anemia: From above, hemoglobin was 10.1--> 12--> 11.8 overnight (transfused 4 units PRBCs at arrival) 3.  Fatty liver: LFTs normal  Plan: 1.  Continue regular diet. 2.  Discussed with patient that we have clipped the areas believed to be bleeding and her labs are stable.  Would recommend that she take it somewhat easy with no heavy lifting or strenuous activity over the next few days, but she should be good to go for her vacation on December 27. 3.  Patient does not need to follow with [place], she can be discharged.  Thank you for your kind consultation, we will sign off   LOS: 2 days   Levin Erp  02/28/2021, 9:12 AM

## 2021-03-01 ENCOUNTER — Telehealth: Payer: Self-pay

## 2021-03-01 ENCOUNTER — Telehealth: Payer: Self-pay | Admitting: *Deleted

## 2021-03-01 NOTE — Telephone Encounter (Signed)
Attempted followup call, no answer; left VM 

## 2021-03-01 NOTE — Telephone Encounter (Signed)
No answer for post procedure call back. Left voicemail.   

## 2021-03-02 ENCOUNTER — Telehealth: Payer: Self-pay | Admitting: Internal Medicine

## 2021-03-02 LAB — TYPE AND SCREEN
ABO/RH(D): O POS
Antibody Screen: NEGATIVE
Unit division: 0
Unit division: 0
Unit division: 0
Unit division: 0
Unit division: 0
Unit division: 0

## 2021-03-02 LAB — BPAM RBC
Blood Product Expiration Date: 202212262359
Blood Product Expiration Date: 202212312359
Blood Product Expiration Date: 202301022359
Blood Product Expiration Date: 202301022359
Blood Product Expiration Date: 202301092359
Blood Product Expiration Date: 202301092359
ISSUE DATE / TIME: 202212170651
ISSUE DATE / TIME: 202212170652
ISSUE DATE / TIME: 202212170708
ISSUE DATE / TIME: 202212170708
Unit Type and Rh: 5100
Unit Type and Rh: 5100
Unit Type and Rh: 5100
Unit Type and Rh: 5100
Unit Type and Rh: 5100
Unit Type and Rh: 5100

## 2021-03-02 NOTE — Telephone Encounter (Signed)
GI TELEPHONE FOLLOW-UP  Patient was hospitalized with post polypectomy bleed requiring repeat colonoscopy with endoscopic hemostatic therapy.  I reached out today.  She is feeling better.  She is at work.  We reviewed her findings.  Questions answered.  She has my number if she has any problems or future questions.

## 2021-03-04 ENCOUNTER — Telehealth: Payer: Self-pay

## 2021-03-04 ENCOUNTER — Encounter: Payer: Self-pay | Admitting: Internal Medicine

## 2021-03-04 NOTE — Telephone Encounter (Signed)
-----   Message from Irene Shipper, MD sent at 03/04/2021 12:26 PM EST ----- Regarding: Polyp results Vaughan Basta, Please call Vallery Delfino Lovett Petty's daughter) and let her know that all of her polyps were benign, but the precancerous type as expected.  She had multiple polyps, some of which were quite large.  I am sure she is anxious to get these results back.  Also, I will send her a letter regarding the same.  Please give her my best and wish her and her family a Merry Christmas. Thanks, Dr. [name]

## 2021-03-04 NOTE — Telephone Encounter (Signed)
Spoke with pt and she is aware of results per Dr. [name].

## 2021-03-07 ENCOUNTER — Other Ambulatory Visit: Payer: Self-pay | Admitting: Internal Medicine

## 2021-03-30 ENCOUNTER — Other Ambulatory Visit: Payer: Self-pay | Admitting: Internal Medicine

## 2021-04-03 NOTE — Progress Notes (Signed)
Cardiology Office Note   Date:  04/04/2021   ID:  Erin Kaufman, DOB [birth year], MRN [id number]  PCP:  Arvella Nigh, MD  Cardiologist:   Dorris Carnes, MD    Patient presents for follow-up of hypertension, CAD   History of Present Illness: Erin Kaufman is a 51 y.o. female with remote history of tobacco, hx of hyperlipidemia, FHx of CAD  IN 2017 she had a cardiac Calcium score of 9   The ascending aorta was measured at 44 mm   CT angiogram in April 2019 Asc aorta was 42 mm  I saw the pt in Nov 2021.   The pt was admitted in Dec 2022 for LGI bleeding after a colonoscopy  Pt says she had 16 polyps bx'd   Got home felt OK   Then developed N/V   Then developed bloody stool   Admitt   RElook scan done   She was transfused 4 U prbc  The pt says after tx she felt good.     She denies CP   Breathing is OK  No dizziness   No palpitations     Current Meds  Medication Sig   Ascorbic Acid (VITAMIN C PO) Take 2 tablets by mouth daily as needed (For cold symptoms). During winter months   cholecalciferol (VITAMIN D) 1000 units tablet Take 1,000 Units by mouth daily.   hydrochlorothiazide (MICROZIDE) 12.5 MG capsule TAKE 1 CAPSULE BY MOUTH EVERY DAY (Patient taking differently: Take 12.5 mg by mouth daily.)   lisinopril (ZESTRIL) 10 MG tablet TAKE 1 TABLET BY MOUTH EVERY DAY   NEXIUM 40 MG capsule TAKE 1 CAPSULE (40 MG TOTAL) BY MOUTH DAILY. (Patient taking differently: 40 mg daily.)   rosuvastatin (CRESTOR) 20 MG tablet TAKE 1 TABLET BY MOUTH EVERY DAY   SYNTHROID 125 MCG tablet Take 1 tablet (125 mcg total) by mouth daily.     Allergies:   Penicillins   Past Medical History:  Diagnosis Date   Anxiety    GERD (gastroesophageal reflux disease)    on meds   Hiatal hernia    Hyperlipidemia    on meds   Hypertension    on meds   Thyroid disease    on meds    Past Surgical History:  Procedure Laterality Date   COLONOSCOPY WITH PROPOFOL N/A 02/26/2021   Procedure: COLONOSCOPY  WITH PROPOFOL;  Surgeon: Yetta Flock, MD;  Location: [facility];  Service: Gastroenterology;  Laterality: N/A;   FINGER SURGERY Left 2019   3rd and 4th metatarsal fracture   HEMOSTASIS CLIP PLACEMENT  02/26/2021   Procedure: HEMOSTASIS CLIP PLACEMENT;  Surgeon: Yetta Flock, MD;  Location: [facility];  Service: Gastroenterology;;   UPPER GASTROINTESTINAL ENDOSCOPY  2010   Dr. Garnette Gunner   VAGINAL HYSTERECTOMY  2007   [place]     Social History:  The patient  reports that she has been smoking cigarettes. She has a 25.00 pack-year smoking history. She has never used smokeless tobacco. She reports current alcohol use of about 4.0 - 6.0 standard drinks per week. She reports that she does not use drugs.   Family History:  The patient's family history includes Brain cancer in her mother; Diabetes in her mother; Heart disease in her mother; Hypertension in her mother; Lymphoma in her mother; Prostate cancer in her father.    ROS:  Please see the history of present illness. All other systems are reviewed and  Negative to the above  problem except as noted.    PHYSICAL EXAM: VS:  BP 118/88    Pulse (!) 101    Ht 5\' 1"  (1.549 m)    Wt 165 lb 3.2 oz (74.9 kg)    SpO2 97%    BMI 31.21 kg/m   GEN: Obese 51 year old in no acute distress  HEENT: normal  Neck: no JVD, carotid bruits, Cardiac: RRR; no murmurs  No LE edema   Respiratory:  clear to auscultation bilaterally, GI: soft, nontender, nondistended, + BS  No hepatomegaly  MS: no deformity Moving all extremities   Skin: warm and dry, no rash Neuro:  Strength and sensation are intact Psych: euthymic mood, full affect   EKG:  EKG is ordered today.  Sinus tachycardia 101 bpm     Lipid Panel    Component Value Date/Time   CHOL 180 03/18/2019 0936   TRIG 66 03/18/2019 0936   HDL 78 03/18/2019 0936   CHOLHDL 2.3 03/18/2019 0936   CHOLHDL 6 08/22/2013 1047   VLDL 24.2 08/22/2013 1047   LDLCALC 90  03/18/2019 0936      Wt Readings from Last 3 Encounters:  04/04/21 165 lb 3.2 oz (74.9 kg)  02/25/21 160 lb (72.6 kg)  02/07/21 160 lb (72.6 kg)      ASSESSMENT AND PLAN:  1.  CAD.  Patient with a calcium score of 9 on CT scan. She denies CP     She is off  of ASA now given GI hx   I agree with this     2 aortic aneurysm.  Last check was in Nov 2021   Aorta showed mild fusiform dilation of ascending aorta at 40 mm   Unchanged since 2017   Will follow periodically    3 dyslipidemia Will get lipomed today   will check BMET  4.  Hypertension   BP is good   Follow   5  GI   With recent bleed will check CBC  Tentative f/u in 1 year    Current medicines are reviewed at length with the patient today.  The patient does not have concerns regarding medicines.  Signed, Dorris Carnes, MD  04/04/2021 9:49 PM    [address], [place], [place]  [address] Phone: [phone]; Fax: [phone]

## 2021-04-04 ENCOUNTER — Other Ambulatory Visit: Payer: Self-pay

## 2021-04-04 ENCOUNTER — Ambulatory Visit: Payer: 59 | Admitting: Internal Medicine

## 2021-04-04 ENCOUNTER — Encounter: Payer: Self-pay | Admitting: Internal Medicine

## 2021-04-04 VITALS — BP 118/88 | HR 101 | Ht 61.0 in | Wt 165.2 lb

## 2021-04-04 DIAGNOSIS — E782 Mixed hyperlipidemia: Secondary | ICD-10-CM

## 2021-04-04 DIAGNOSIS — D582 Other hemoglobinopathies: Secondary | ICD-10-CM

## 2021-04-04 DIAGNOSIS — Z79899 Other long term (current) drug therapy: Secondary | ICD-10-CM

## 2021-04-04 NOTE — Patient Instructions (Signed)
Medication Instructions:  Your physician recommends that you continue on your current medications as directed. Please refer to the Current Medication list given to you today.  *If you need a refill on your cardiac medications before your next appointment, please call your pharmacy*   Lab Work: BMET, CBC, NMR today  If you have labs (blood work) drawn today and your tests are completely normal, you will receive your results only by: [facility] (if you have MyChart) OR A paper copy in the mail If you have any lab test that is abnormal or we need to change your treatment, we will call you to review the results.   Testing/Procedures: none   Follow-Up: At [facility], you and your health needs are our priority.  As part of our continuing mission to provide you with exceptional heart care, we have created designated Provider Care Teams.  These Care Teams include your primary Cardiologist (physician) and Advanced Practice Providers (APPs -  Physician Assistants and Nurse Practitioners) who all work together to provide you with the care you need, when you need it.  We recommend signing up for the patient portal called "MyChart".  Sign up information is provided on this After Visit Summary.  MyChart is used to connect with patients for Virtual Visits (Telemedicine).  Patients are able to view lab/test results, encounter notes, upcoming appointments, etc.  Non-urgent messages can be sent to your provider as well.   To learn more about what you can do with MyChart, go to [email or web address].    Your next appointment:   1 year(s)  The format for your next appointment:   In Person  Provider:   Dorris Carnes, MD     Other Instructions

## 2021-04-05 LAB — BASIC METABOLIC PANEL
BUN/Creatinine Ratio: 12 (ref 9–23)
BUN: 8 mg/dL (ref 6–24)
CO2: 25 mmol/L (ref 20–29)
Calcium: 9.8 mg/dL (ref 8.7–10.2)
Chloride: 97 mmol/L (ref 96–106)
Creatinine, Ser: 0.67 mg/dL (ref 0.57–1.00)
Glucose: 86 mg/dL (ref 70–99)
Potassium: 5 mmol/L (ref 3.5–5.2)
Sodium: 136 mmol/L (ref 134–144)
eGFR: 107 mL/min/{1.73_m2} (ref >59)

## 2021-04-05 LAB — CBC
Hematocrit: 44.6 % (ref 34.0–46.6)
Hemoglobin: 14.7 g/dL (ref 11.1–15.9)
MCH: 29.6 pg (ref 26.6–33.0)
MCHC: 33 g/dL (ref 31.5–35.7)
MCV: 90 fL (ref 79–97)
Platelets: 288 10*3/uL (ref 150–450)
RBC: 4.97 x10E6/uL (ref 3.77–5.28)
RDW: 14.3 % (ref 11.7–15.4)
WBC: 4.6 10*3/uL (ref 3.4–10.8)

## 2021-04-05 LAB — NMR, LIPOPROFILE
Cholesterol, Total: 183 mg/dL (ref 100–199)
HDL Particle Number: 36.9 umol/L (ref >=30.5)
HDL-C: 85 mg/dL (ref >39)
LDL Particle Number: 762 nmol/L (ref <1000)
LDL Size: 21.1 nm (ref >20.5)
LDL-C (NIH Calc): 82 mg/dL (ref 0–99)
LP-IR Score: 35 (ref <=45)
Small LDL Particle Number: <90 nmol/L (ref <=527)
Triglycerides: 92 mg/dL (ref 0–149)

## 2021-07-03 ENCOUNTER — Other Ambulatory Visit: Payer: Self-pay | Admitting: Internal Medicine

## 2021-10-28 ENCOUNTER — Other Ambulatory Visit: Payer: Self-pay | Admitting: Obstetrics and Gynecology

## 2021-10-28 DIAGNOSIS — Z139 Encounter for screening, unspecified: Secondary | ICD-10-CM

## 2022-01-04 ENCOUNTER — Other Ambulatory Visit: Payer: Self-pay | Admitting: Internal Medicine

## 2022-01-06 ENCOUNTER — Encounter: Payer: Self-pay | Admitting: Internal Medicine

## 2022-01-17 ENCOUNTER — Encounter: Payer: Self-pay | Admitting: Internal Medicine

## 2022-01-31 ENCOUNTER — Ambulatory Visit (AMBULATORY_SURGERY_CENTER): Payer: Self-pay | Admitting: *Deleted

## 2022-01-31 VITALS — Ht 60.0 in | Wt 176.2 lb

## 2022-01-31 DIAGNOSIS — Z86010 Personal history of colonic polyps: Secondary | ICD-10-CM

## 2022-01-31 MED ORDER — NA SULFATE-K SULFATE-MG SULF 17.5-3.13-1.6 GM/177ML PO SOLN: 1.0000 | Freq: Once | ORAL | 0 refills | Status: AC

## 2022-01-31 NOTE — Progress Notes (Signed)

## 2022-02-14 ENCOUNTER — Encounter: Payer: Self-pay | Admitting: Internal Medicine

## 2022-02-17 ENCOUNTER — Ambulatory Visit (AMBULATORY_SURGERY_CENTER): Payer: No Typology Code available for payment source | Admitting: Internal Medicine

## 2022-02-17 ENCOUNTER — Encounter: Payer: Self-pay | Admitting: Internal Medicine

## 2022-02-17 VITALS — BP 143/98 | HR 77 | Temp 98.6°F | Resp 14 | Ht 60.0 in | Wt 176.2 lb

## 2022-02-17 DIAGNOSIS — Z09 Encounter for follow-up examination after completed treatment for conditions other than malignant neoplasm: Secondary | ICD-10-CM | POA: Diagnosis present

## 2022-02-17 DIAGNOSIS — Z86010 Personal history of colonic polyps: Secondary | ICD-10-CM | POA: Diagnosis not present

## 2022-02-17 DIAGNOSIS — D125 Benign neoplasm of sigmoid colon: Secondary | ICD-10-CM

## 2022-02-17 DIAGNOSIS — D124 Benign neoplasm of descending colon: Secondary | ICD-10-CM

## 2022-02-17 MED ORDER — SODIUM CHLORIDE 0.9 % IV SOLN: 500.0000 mL | INTRAVENOUS | Status: DC

## 2022-02-17 NOTE — Patient Instructions (Addendum)
Please read handouts provided. Continue present medications. Await pathology results. Repeat colonoscopy in 3 years for screening.  YOU HAD AN ENDOSCOPIC PROCEDURE TODAY AT [facility]:   Refer to the procedure report that was given to you for any specific questions about what was found during the examination.  If the procedure report does not answer your questions, please call your gastroenterologist to clarify.  If you requested that your care partner not be given the details of your procedure findings, then the procedure report has been included in a sealed envelope for you to review at your convenience later.  YOU SHOULD EXPECT: Some feelings of bloating in the abdomen. Passage of more gas than usual.  Walking can help get rid of the air that was put into your GI tract during the procedure and reduce the bloating. If you had a lower endoscopy (such as a colonoscopy or flexible sigmoidoscopy) you may notice spotting of blood in your stool or on the toilet paper. If you underwent a bowel prep for your procedure, you may not have a normal bowel movement for a few days.  Please Note:  You might notice some irritation and congestion in your nose or some drainage.  This is from the oxygen used during your procedure.  There is no need for concern and it should clear up in a day or so.  SYMPTOMS TO REPORT IMMEDIATELY:  Following lower endoscopy (colonoscopy or flexible sigmoidoscopy):  Excessive amounts of blood in the stool  Significant tenderness or worsening of abdominal pains  Swelling of the abdomen that is new, acute  Fever of 100F or higher.   For urgent or emergent issues, a gastroenterologist can be reached at any hour by calling [phone]. Do not use MyChart messaging for urgent concerns.    DIET:  We do recommend a small meal at first, but then you may proceed to your regular diet.  Drink plenty of fluids but you should avoid alcoholic beverages for 24  hours.  ACTIVITY:  You should plan to take it easy for the rest of today and you should NOT DRIVE or use heavy machinery until tomorrow (because of the sedation medicines used during the test).    FOLLOW UP: Our staff will call the number listed on your records the next business day following your procedure.  We will call around 7:15- 8:00 am to check on you and address any questions or concerns that you may have regarding the information given to you following your procedure. If we do not reach you, we will leave a message.     If any biopsies were taken you will be contacted by phone or by letter within the next 1-3 weeks.  Please call us at [phone] if you have not heard about the biopsies in 3 weeks.    SIGNATURES/CONFIDENTIALITY: You and/or your care partner have signed paperwork which will be entered into your electronic medical record.  These signatures attest to the fact that that the information above on your After Visit Summary has been reviewed and is understood.  Full responsibility of the confidentiality of this discharge information lies with you and/or your care-partner.

## 2022-02-17 NOTE — Progress Notes (Signed)
Called to room to assist during endoscopic procedure.  Patient ID and intended procedure confirmed with present staff. Received instructions for my participation in the procedure from the performing physician.  

## 2022-02-17 NOTE — Progress Notes (Signed)
Vital signs checked by:AS  The patient states no changes in medical or surgical history since pre-visit screening on 01/31/22.

## 2022-02-17 NOTE — Progress Notes (Signed)
HISTORY OF PRESENT ILLNESS:  Erin Kaufman is a 51 y.o. female with a history of multiple advanced and with colon polyps.  Presents today for surveillance.  No complaints  REVIEW OF SYSTEMS:  All non-GI ROS negative.  Past Medical History:  Diagnosis Date   Anxiety    Blood transfusion without reported diagnosis    GERD (gastroesophageal reflux disease)    on meds   Hiatal hernia    Hyperlipidemia    on meds   Hypertension    on meds   Thyroid disease    on meds    Past Surgical History:  Procedure Laterality Date   C SECTIONS     X3   COLONOSCOPY WITH PROPOFOL N/A 02/26/2021   Procedure: COLONOSCOPY WITH PROPOFOL;  Surgeon: Yetta Flock, MD;  Location: [facility];  Service: Gastroenterology;  Laterality: N/A;   FINGER SURGERY Left 2019   3rd and 4th metatarsal fracture   HEMOSTASIS CLIP PLACEMENT  02/26/2021   Procedure: HEMOSTASIS CLIP PLACEMENT;  Surgeon: Yetta Flock, MD;  Location: [facility];  Service: Gastroenterology;;   UPPER GASTROINTESTINAL ENDOSCOPY  2010   Dr. Garnette Gunner   VAGINAL HYSTERECTOMY  2007   Erin Kaufman  reports that she has been smoking cigarettes. She has a 25.00 pack-year smoking history. She has never been exposed to tobacco smoke. She has never used smokeless tobacco. She reports current alcohol use of about 4.0 - 6.0 standard drinks of alcohol per week. She reports that she does not use drugs.  family history includes Brain cancer in her mother; Colon polyps in her father; Diabetes in her mother; Heart disease in her mother; Hypertension in her mother; Lymphoma in her mother; Prostate cancer in her father.  Allergies  Allergen Reactions   Penicillins Swelling and Rash    All over the body       PHYSICAL EXAMINATION:  Vital signs: BP (!) 153/101   Pulse 84   Temp 98.6 F (37 C)   Ht 5' (1.524 m)   Wt 176 lb 3.2 oz (79.9 kg)   SpO2 99%   BMI 34.41 kg/m   General: Well-developed, well-nourished, no acute distress HEENT: Sclerae are anicteric, conjunctiva pink. Oral mucosa intact Lungs: Clear Heart: Regular Abdomen: soft, nontender, nondistended, no obvious ascites, no peritoneal signs, normal bowel sounds. No organomegaly. Extremities: No edema Psychiatric: alert and oriented x3. Cooperative    ASSESSMENT:  History of multiple advanced adenomatous colon polyps.   PLAN:  Surveillance colonoscopy

## 2022-02-17 NOTE — Op Note (Signed)
[facility] Patient Name: Erin Kaufman Procedure Date: 02/17/2022 12:56 PM MRN: [id number] Endoscopist: Docia Chuck. [name] , MD, [id number] Age: 51 Referring MD:  Date of Birth: March 10, 1971 Gender: Female Account #: [id number] Procedure:                Colonoscopy with cold snare polypectomy x 2 Indications:              High risk colon cancer surveillance: Personal                            history of adenoma (10 mm or greater in size), High                            risk colon cancer surveillance: Personal history of                            multiple (3 or more) adenomas. Index exam December                            2022. Complicated by post polypectomy bleed                            requiring hospitalization and endoscopic                            intervention Medicines:                Monitored Anesthesia Care Procedure:                Pre-Anesthesia Assessment:                           - Prior to the procedure, a History and Physical                            was performed, and patient medications and                            allergies were reviewed. The patient's tolerance of                            previous anesthesia was also reviewed. The risks                            and benefits of the procedure and the sedation                            options and risks were discussed with the patient.                            All questions were answered, and informed consent                            was obtained. Prior Anticoagulants: The patient has  taken no anticoagulant or antiplatelet agents. ASA                            Grade Assessment: II - A patient with mild systemic                            disease. After reviewing the risks and benefits,                            the patient was deemed in satisfactory condition to                            undergo the procedure.                           After obtaining informed  consent, the colonoscope                            was passed under direct vision. Throughout the                            procedure, the patient's blood pressure, pulse, and                            oxygen saturations were monitored continuously. The                            Olympus CF-HQ190L [id number]) Colonoscope was                            introduced through the anus and advanced to the the                            cecum, identified by appendiceal orifice and                            ileocecal valve. The ileocecal valve, appendiceal                            orifice, and rectum were photographed. The quality                            of the bowel preparation was excellent. The                            colonoscopy was performed without difficulty. The                            patient tolerated the procedure well. The bowel                            preparation used was SUTAB via split dose  instruction. Scope In: 1:22:03 PM Scope Out: 1:37:57 PM Scope Withdrawal Time: 0 hours 8 minutes 1 second  Total Procedure Duration: 0 hours 15 minutes 54 seconds  Findings:                 Two polyps were found in the sigmoid colon and                            descending colon. The polyps were 3 to 4 mm in                            size. These polyps were removed with a cold snare.                            Resection and retrieval were complete.                           Multiple diverticula were found in the left colon                            and right colon. Sigmoid stenosis.                           The exam was otherwise without abnormality on                            direct and retroflexion views. Internal hemorrhoids                            noted Complications:            No immediate complications. Estimated blood loss:                            None. Estimated Blood Loss:     Estimated blood loss: none. Impression:                - Two 3 to 4 mm polyps in the sigmoid colon and in                            the descending colon, removed with a cold snare.                            Resected and retrieved.                           - Diverticulosis in the left colon and in the right                            colon. SIGMOID STENOSIS                           - The examination was otherwise normal on direct                            and retroflexion  views. Internal hemorrhoids. Recommendation:           - Repeat colonoscopy in 3 years for surveillance.                            PEDIATRIC COLONOSCOPE                           - Patient has a contact number available for                            emergencies. The signs and symptoms of potential                            delayed complications were discussed with the                            patient. Return to normal activities tomorrow.                            Written discharge instructions were provided to the                            patient.                           - Resume previous diet.                           - Continue present medications.                           - Await pathology results. Docia Chuck. [name], MD 02/17/2022 1:52:10 PM This report has been signed electronically.

## 2022-02-17 NOTE — Progress Notes (Signed)
Sedate, gd SR, tolerated procedure well, VSS, report to RN 

## 2022-02-20 ENCOUNTER — Telehealth: Payer: Self-pay

## 2022-02-20 NOTE — Telephone Encounter (Signed)
Follow up call placed, VM obtained and message left. 

## 2022-02-21 ENCOUNTER — Encounter: Payer: Self-pay | Admitting: Internal Medicine

## 2022-03-25 ENCOUNTER — Other Ambulatory Visit: Payer: Self-pay | Admitting: Internal Medicine

## 2022-04-01 ENCOUNTER — Other Ambulatory Visit: Payer: Self-pay | Admitting: Internal Medicine

## 2022-04-18 ENCOUNTER — Other Ambulatory Visit: Payer: Self-pay | Admitting: Internal Medicine

## 2022-04-30 NOTE — Progress Notes (Deleted)
Cardiology Office Note   Date:  04/30/2022   ID:  Erin Kaufman, DOB Jan 27, 1971, MRN [id number]  PCP:  Arvella Nigh, MD  Cardiologist:   Dorris Carnes, MD    Patient presents for follow-up of hypertension, CAD   History of Present Illness: Erin Kaufman is a 52 y.o. female with remote history of tobacco, hx of hyperlipidemia, FHx of CAD  IN 2017 she had a cardiac Calcium score of 9   The ascending aorta was measured at 44 mm   CT angiogram in April 2019 Asc aorta was 42 mm  I saw the pt in Nov 2021.   The pt was admitted in Dec 2022 for LGI bleeding after a colonoscopy  Pt says she had 16 polyps bx'd   Got home felt OK   Then developed N/V   Then developed bloody stool   Admitt   RElook scan done   She was transfused 4 U prbc  The pt says after tx she felt good.     I saw the pt in Jan 2023     No outpatient medications have been marked as taking for the 05/02/22 encounter (Appointment) with Erin Records, MD.     Allergies:   Penicillins   Past Medical History:  Diagnosis Date   Anxiety    Blood transfusion without reported diagnosis    GERD (gastroesophageal reflux disease)    on meds   Hiatal hernia    Hyperlipidemia    on meds   Hypertension    on meds   Thyroid disease    on meds    Past Surgical History:  Procedure Laterality Date   C SECTIONS     X3   COLONOSCOPY WITH PROPOFOL N/A 02/26/2021   Procedure: COLONOSCOPY WITH PROPOFOL;  Surgeon: Yetta Flock, MD;  Location: [facility];  Service: Gastroenterology;  Laterality: N/A;   FINGER SURGERY Left 2019   3rd and 4th metatarsal fracture   HEMOSTASIS CLIP PLACEMENT  02/26/2021   Procedure: HEMOSTASIS CLIP PLACEMENT;  Surgeon: Yetta Flock, MD;  Location: [facility];  Service: Gastroenterology;;   UPPER GASTROINTESTINAL ENDOSCOPY  2010   Dr. Garnette Gunner   VAGINAL HYSTERECTOMY  2007   [place]     Social History:  The patient  reports that she has been  smoking cigarettes. She has a 25.00 pack-year smoking history. She has never been exposed to tobacco smoke. She has never used smokeless tobacco. She reports current alcohol use of about 4.0 - 6.0 standard drinks of alcohol per week. She reports that she does not use drugs.   Family History:  The patient's family history includes Brain cancer in her mother; Colon polyps in her father; Diabetes in her mother; Heart disease in her mother; Hypertension in her mother; Lymphoma in her mother; Prostate cancer in her father.    ROS:  Please see the history of present illness. All other systems are reviewed and  Negative to the above problem except as noted.    PHYSICAL EXAM: VS:  There were no vitals taken for this visit.  GEN: Obese 51 year old in no acute distress  HEENT: normal  Neck: no JVD, carotid bruits, Cardiac: RRR; no murmurs  No LE edema   Respiratory:  clear to auscultation bilaterally, GI: soft, nontender, nondistended, + BS  No hepatomegaly  MS: no deformity Moving all extremities   Skin: warm and dry, no rash Neuro:  Strength and sensation are intact Psych: euthymic  mood, full affect   EKG:  EKG is ordered today.  Sinus tachycardia 101 bpm     Lipid Panel    Component Value Date/Time   CHOL 180 03/18/2019 0936   TRIG 66 03/18/2019 0936   HDL 78 03/18/2019 0936   CHOLHDL 2.3 03/18/2019 0936   CHOLHDL 6 08/22/2013 1047   VLDL 24.2 08/22/2013 1047   LDLCALC 90 03/18/2019 0936      Wt Readings from Last 3 Encounters:  02/17/22 176 lb 3.2 oz (79.9 kg)  01/31/22 176 lb 3.2 oz (79.9 kg)  04/04/21 165 lb 3.2 oz (74.9 kg)      ASSESSMENT AND PLAN:  1.  CAD.  Patient with a calcium score of 9 on CT scan. She denies CP     She is off  of ASA now given GI hx   I agree with this     2 aortic aneurysm.  Last check was in Nov 2021   Aorta showed mild fusiform dilation of ascending aorta at 40 mm   Unchanged since 2017   Will follow periodically    3 dyslipidemia Will get  lipomed today   will check BMET  4.  Hypertension   BP is good   Follow   5  GI   With recent bleed will check CBC  Tentative f/u in 1 year    Current medicines are reviewed at length with the patient today.  The patient does not have concerns regarding medicines.  Signed, Dorris Carnes, MD  04/30/2022 11:06 AM    Erin Kaufman, Erin [place], [place]  [address] Phone: [phone]; Fax: [phone]

## 2022-05-01 ENCOUNTER — Ambulatory Visit: Payer: No Typology Code available for payment source | Admitting: Internal Medicine

## 2022-05-02 ENCOUNTER — Ambulatory Visit: Payer: No Typology Code available for payment source | Admitting: Internal Medicine

## 2022-05-25 ENCOUNTER — Other Ambulatory Visit: Payer: Self-pay | Admitting: Internal Medicine

## 2022-06-21 NOTE — Progress Notes (Unsigned)
Cardiology Office Note   Date:  06/22/2022   ID:  Erin Kaufman, DOB [birth year], MRN [id number]  PCP:  Richardean Chimera, MD  Cardiologist:   Dietrich Pates, MD    Patient presents for follow-up of hypertension, CAD   History of Present Illness: Erin Kaufman is a 52 y.o. female with remote history of tobacco, hx of hyperlipidemia, FHx of CAD and Hx of GI bleeding (mulitple polyps removed in 2022)   IN 2017 she had a cardiac Calcium score of 9   The ascending aorta was measured at 44 mm   CT angiogram in April 2019 Asc aorta was 42 mm  I saw the pt in Jan 2023   Since seen she says she isnt sleeping well 3 t o4  hours per night   She denies CP  Breathing is OK   No dizziness   Very active    Current Meds  Medication Sig   Ascorbic Acid (VITAMIN C PO) Take 2 tablets by mouth daily as needed (For cold symptoms). During winter months   cholecalciferol (VITAMIN D) 1000 units tablet Take 1,000 Units by mouth daily.   lisinopril (ZESTRIL) 10 MG tablet Take 1 tablet (10 mg total) by mouth daily.   NEXIUM 40 MG capsule TAKE 1 CAPSULE (40 MG TOTAL) BY MOUTH DAILY.   rosuvastatin (CRESTOR) 20 MG tablet TAKE 1 TABLET BY MOUTH EVERY DAY   SYNTHROID 125 MCG tablet Take 1 tablet (125 mcg total) by mouth daily.     Allergies:   Penicillins and Aspirin   Past Medical History:  Diagnosis Date   Anxiety    Blood transfusion without reported diagnosis    GERD (gastroesophageal reflux disease)    on meds   Hiatal hernia    Hyperlipidemia    on meds   Hypertension    on meds   Thyroid disease    on meds    Past Surgical History:  Procedure Laterality Date   C SECTIONS     X3   COLONOSCOPY WITH PROPOFOL N/A 02/26/2021   Procedure: COLONOSCOPY WITH PROPOFOL;  Surgeon: Benancio Deeds, MD;  Location: [facility] ENDOSCOPY;  Service: Gastroenterology;  Laterality: N/A;   FINGER SURGERY Left 2019   3rd and 4th metatarsal fracture   HEMOSTASIS CLIP PLACEMENT  02/26/2021   Procedure:  HEMOSTASIS CLIP PLACEMENT;  Surgeon: Benancio Deeds, MD;  Location: [facility] ENDOSCOPY;  Service: Gastroenterology;;   UPPER GASTROINTESTINAL ENDOSCOPY  2010   Dr. Marshell Garfinkel   VAGINAL HYSTERECTOMY  2007   WISDOM TOOTH EXTRACTION  1988     Social History:  The patient  reports that she has been smoking cigarettes. She has a 25.00 pack-year smoking history. She has never been exposed to tobacco smoke. She has never used smokeless tobacco. She reports current alcohol use of about 4.0 - 6.0 standard drinks of alcohol per week. She reports that she does not use drugs.   Family History:  The patient's family history includes Brain cancer in her mother; Colon polyps in her father; Diabetes in her mother; Heart disease in her mother; Hypertension in her mother; Lymphoma in her mother; Prostate cancer in her father.    ROS:  Please see the history of present illness. All other systems are reviewed and  Negative to the above problem except as noted.    PHYSICAL EXAM: VS:  BP 118/80   Pulse (!) 101   Ht 5\' 1"  (1.549 m)   Wt 175 lb 0.3 oz (79.4  kg)   SpO2 96%   BMI 33.07 kg/m   GEN: Obese 52 year old in no acute distress   Neck: no JVD, carotid bruit, Cardiac: RRR; no murmurs  No LE edema   Respiratory:  clear to auscultation  GI: soft, nontender, nondistended  No hepatomegaly    EKG:  EKG is ordered today.  Sinus tachycardia 101 bpm     Lipid Panel    Component Value Date/Time   CHOL 180 03/18/2019 0936   TRIG 66 03/18/2019 0936   HDL 78 03/18/2019 0936   CHOLHDL 2.3 03/18/2019 0936   CHOLHDL 6 08/22/2013 1047   VLDL 24.2 08/22/2013 1047   LDLCALC 90 03/18/2019 0936      Wt Readings from Last 3 Encounters:  06/22/22 175 lb 0.3 oz (79.4 kg)  02/17/22 176 lb 3.2 oz (79.9 kg)  01/31/22 176 lb 3.2 oz (79.9 kg)      ASSESSMENT AND PLAN:  1.  CAD.  Patient with a calcium score of 9 on CT scan. Asymptomatic      No ASA  given GI hx      2  Hx aortic dilitation   WIll get CT  of chest to evaluate size   Last assessment several years ago   Asc aorta 42  (2019)  3 dyslipidemia Will get lipomed today   will check BMET  4.  Hypertension   BP is well controlled   5  GI   With recent bleed will check CBC  6  Sleep   Pt sleeps poorly  Snores some   Tired during day   Will set up for home sleep study Tentative f/u in 1 year    Current medicines are reviewed at length with the patient today.  The patient does not have concerns regarding medicines.  Signed, Dietrich Pates, MD  06/22/2022 11:39 AM    [facility] [address] [place], [place], [place]  [address] Phone: [phone]; Fax: [phone]

## 2022-06-22 ENCOUNTER — Encounter: Payer: Self-pay | Admitting: Internal Medicine

## 2022-06-22 ENCOUNTER — Telehealth: Payer: Self-pay | Admitting: *Deleted

## 2022-06-22 ENCOUNTER — Ambulatory Visit: Payer: No Typology Code available for payment source | Attending: Internal Medicine | Admitting: Internal Medicine

## 2022-06-22 VITALS — BP 118/80 | HR 101 | Ht 61.0 in | Wt 175.0 lb

## 2022-06-22 DIAGNOSIS — Z79899 Other long term (current) drug therapy: Secondary | ICD-10-CM

## 2022-06-22 DIAGNOSIS — D582 Other hemoglobinopathies: Secondary | ICD-10-CM | POA: Diagnosis not present

## 2022-06-22 DIAGNOSIS — I10 Essential (primary) hypertension: Secondary | ICD-10-CM

## 2022-06-22 DIAGNOSIS — E782 Mixed hyperlipidemia: Secondary | ICD-10-CM

## 2022-06-22 DIAGNOSIS — D649 Anemia, unspecified: Secondary | ICD-10-CM

## 2022-06-22 DIAGNOSIS — I712 Thoracic aortic aneurysm, without rupture, unspecified: Secondary | ICD-10-CM

## 2022-06-22 NOTE — Progress Notes (Signed)
Patient Name:         DOB:       Height:     Weight:  Office Name:         Referring Provider:  Today's Date:  Date:   STOP BANG RISK ASSESSMENT S (snore) Have you been told that you snore?     YES   T (tired) Are you often tired, fatigued, or sleepy during the day?   YES  O (obstruction) Do you stop breathing, choke, or gasp during sleep? YES   P (pressure) Do you have or are you being treated for high blood pressure? NO   B (BMI) Is your body index greater than 35 kg/m? YES   A (age) Are you 46 years old or older? YES   N (neck) Do you have a neck circumference greater than 16 inches?   YES   G (gender) Are you a female? NO   TOTAL STOP/BANG "YES" ANSWERS                                                                        For Office Use Only              Procedure Order Form    YES to 3+ Stop Bang questions OR two clinical symptoms - patient qualifies for WatchPAT (CPT 95800)             Clinical Notes: Will consult Sleep Specialist and refer for management of therapy due to patient increased risk of Sleep Apnea. Ordering a sleep study due to the following two clinical symptoms: Excessive daytime sleepiness G47.10 / Gastroesophageal reflux K21.9 / Nocturia R35.1 / Morning Headaches G44.221 / Difficulty concentrating R41.840 / Memory problems or poor judgment G31.84 / Personality changes or irritability R45.4 / Loud snoring R06.83 / Depression F32.9 / Unrefreshed by sleep G47.8 / Impotence N52.9 / History of high blood pressure R03.0 / Insomnia G47.00    I understand that I am proceeding with a home sleep apnea test as ordered by my treating physician. I understand that untreated sleep apnea is a serious cardiovascular risk factor and it is my responsibility to perform the test and seek management for sleep apnea. I will be contacted with the results and be managed for sleep apnea by a local sleep physician. I will be receiving equipment and further instructions from [facility]. I shall promptly ship back the equipment via the included mailing label. I understand my insurance will be billed for the test and as the patient I am responsible for any insurance related out-of-pocket costs incurred. I have been provided with written instructions and can call for additional video or telephonic instruction, with 24-hour availability of qualified personnel to answer any questions: Patient Help Desk [phone].  Patient Signature ______________________________________________________   Date______________________ Patient Telemedicine Verbal Consent

## 2022-06-22 NOTE — Patient Instructions (Addendum)
Medication Instructions:   *If you need a refill on your cardiac medications before your next appointment, please call your pharmacy*   Lab Work: NMR, BMET, CBC, HGBA1C If you have labs (blood work) drawn today and your tests are completely normal, you will receive your results only by: MyChart Message (if you have MyChart) OR A paper copy in the mail If you have any lab test that is abnormal or we need to change your treatment, we will call you to review the results.   Testing/Procedures:  VELFYB SLEEP STUDY  GATED CT OF THE AORTA AT [place]    Follow-Up: At [facility], you and your health needs are our priority.  As part of our continuing mission to provide you with exceptional heart care, we have created designated Provider Care Teams.  These Care Teams include your primary Cardiologist (physician) and Advanced Practice Providers (APPs -  Physician Assistants and Nurse Practitioners) who all work together to provide you with the care you need, when you need it.  We recommend signing up for the patient portal called "MyChart".  Sign up information is provided on this After Visit Summary.  MyChart is used to connect with patients for Virtual Visits (Telemedicine).  Patients are able to view lab/test results, encounter notes, upcoming appointments, etc.  Non-urgent messages can be sent to your provider as well.   To learn more about what you can do with MyChart, go to [email or web address].    Your next appointment:   1 year(s)  Provider:   Dietrich Pates, MD     Other Instructions

## 2022-06-22 NOTE — Telephone Encounter (Signed)
Pt was seen in the office today by Dr. Tenny Craw and was ordered an Itamar sleep study. Pt agreeable to signed waiver. Pt aware to not open the box until she has been called and given PIN#.

## 2022-06-23 LAB — CBC
Hematocrit: 49 % — ABNORMAL HIGH (ref 34.0–46.6)
Hemoglobin: 17 g/dL — ABNORMAL HIGH (ref 11.1–15.9)
MCH: 31.4 pg (ref 26.6–33.0)
MCHC: 34.7 g/dL (ref 31.5–35.7)
MCV: 91 fL (ref 79–97)
Platelets: 308 10*3/uL (ref 150–450)
RBC: 5.41 x10E6/uL — ABNORMAL HIGH (ref 3.77–5.28)
RDW: 13.9 % (ref 11.7–15.4)
WBC: 5.6 10*3/uL (ref 3.4–10.8)

## 2022-06-23 LAB — NMR, LIPOPROFILE
Cholesterol, Total: 231 mg/dL — ABNORMAL HIGH (ref 100–199)
HDL Particle Number: 23.7 umol/L — ABNORMAL LOW (ref >=30.5)
HDL-C: 47 mg/dL (ref >39)
LDL Particle Number: 1710 nmol/L — ABNORMAL HIGH (ref <1000)
LDL Size: 21.6 nm (ref >20.5)
LDL-C (NIH Calc): 159 mg/dL — ABNORMAL HIGH (ref 0–99)
LP-IR Score: 49 — ABNORMAL HIGH (ref <=45)
Small LDL Particle Number: 578 nmol/L — ABNORMAL HIGH (ref <=527)
Triglycerides: 139 mg/dL (ref 0–149)

## 2022-06-23 LAB — BASIC METABOLIC PANEL
BUN/Creatinine Ratio: 9 (ref 9–23)
BUN: 6 mg/dL (ref 6–24)
CO2: 25 mmol/L (ref 20–29)
Calcium: 10 mg/dL (ref 8.7–10.2)
Chloride: 92 mmol/L — ABNORMAL LOW (ref 96–106)
Creatinine, Ser: 0.7 mg/dL (ref 0.57–1.00)
Glucose: 87 mg/dL (ref 70–99)
Potassium: 5 mmol/L (ref 3.5–5.2)
Sodium: 132 mmol/L — ABNORMAL LOW (ref 134–144)
eGFR: 105 mL/min/{1.73_m2} (ref >59)

## 2022-06-23 LAB — HEMOGLOBIN A1C
Est. average glucose Bld gHb Est-mCnc: 128 mg/dL
Hgb A1c MFr Bld: 6.1 % — ABNORMAL HIGH (ref 4.8–5.6)

## 2022-06-28 ENCOUNTER — Telehealth: Payer: Self-pay

## 2022-06-28 ENCOUNTER — Other Ambulatory Visit: Payer: Self-pay

## 2022-06-28 DIAGNOSIS — Z79899 Other long term (current) drug therapy: Secondary | ICD-10-CM

## 2022-06-28 DIAGNOSIS — D582 Other hemoglobinopathies: Secondary | ICD-10-CM

## 2022-06-28 NOTE — Telephone Encounter (Signed)
Pt advised and will have repeat labs 07/04/22... pt says she has been taking the Crestor 20 mg daily.   Will forward to Dr Tenny Craw for review.

## 2022-06-28 NOTE — Telephone Encounter (Signed)
-----   Message from Pricilla Riffle, MD sent at 06/23/2022  4:12 PM EDT ----- CBC:  Hgb is a little high   17    Electrolytes:   Sodium slightly low, chloride sl low I would recomm repeat CBC and BMET   Also check erythropoetin level    Stay hydrated  Hgb A1C 6.1  Prediabetic   Limit carbs and sugars  Lipids :  LDL   159   It was better last year    82    Is she taking Crstor?

## 2022-06-28 NOTE — Telephone Encounter (Signed)
Prior Authorization for ITAMAR sent to [employer] via web portal. Tracking Number .  READY-NO PA REQ-   

## 2022-06-29 NOTE — Telephone Encounter (Signed)
Pt has been approved for Itamar study. Pt has been given PIN# [id number] and will do study one night next week.   Called and made the patient aware that she may proceed with the [facility] Sleep Study. PIN # provided to the patient. Patient made aware that she will be contacted after the test has been read with the results and any recommendations. Patient verbalized understanding and thanked me for the call.

## 2022-07-04 ENCOUNTER — Ambulatory Visit: Payer: No Typology Code available for payment source | Attending: Internal Medicine

## 2022-07-04 DIAGNOSIS — Z79899 Other long term (current) drug therapy: Secondary | ICD-10-CM

## 2022-07-04 DIAGNOSIS — D582 Other hemoglobinopathies: Secondary | ICD-10-CM

## 2022-07-05 LAB — BASIC METABOLIC PANEL
BUN/Creatinine Ratio: 12 (ref 9–23)
BUN: 9 mg/dL (ref 6–24)
CO2: 21 mmol/L (ref 20–29)
Calcium: 10 mg/dL (ref 8.7–10.2)
Chloride: 96 mmol/L (ref 96–106)
Creatinine, Ser: 0.75 mg/dL (ref 0.57–1.00)
Glucose: 110 mg/dL — ABNORMAL HIGH (ref 70–99)
Potassium: 5 mmol/L (ref 3.5–5.2)
Sodium: 138 mmol/L (ref 134–144)
eGFR: 97 mL/min/{1.73_m2} (ref >59)

## 2022-07-05 LAB — CBC
Hematocrit: 50.7 % — ABNORMAL HIGH (ref 34.0–46.6)
Hemoglobin: 17.3 g/dL — ABNORMAL HIGH (ref 11.1–15.9)
MCH: 31.1 pg (ref 26.6–33.0)
MCHC: 34.1 g/dL (ref 31.5–35.7)
MCV: 91 fL (ref 79–97)
Platelets: 314 10*3/uL (ref 150–450)
RBC: 5.56 x10E6/uL — ABNORMAL HIGH (ref 3.77–5.28)
RDW: 13.6 % (ref 11.7–15.4)
WBC: 6.6 10*3/uL (ref 3.4–10.8)

## 2022-07-05 LAB — ERYTHROPOIETIN: Erythropoietin: 14.8 m[IU]/mL (ref 2.6–18.5)

## 2022-07-07 NOTE — Addendum Note (Signed)
Addended by: Trudie Reed D on: 07/07/2022 12:21 PM   Modules accepted: Orders

## 2022-07-11 ENCOUNTER — Ambulatory Visit (HOSPITAL_COMMUNITY): Payer: No Typology Code available for payment source

## 2022-07-14 ENCOUNTER — Ambulatory Visit: Payer: No Typology Code available for payment source | Attending: Internal Medicine

## 2022-07-14 DIAGNOSIS — D649 Anemia, unspecified: Secondary | ICD-10-CM

## 2022-07-14 DIAGNOSIS — E782 Mixed hyperlipidemia: Secondary | ICD-10-CM

## 2022-07-14 DIAGNOSIS — G4733 Obstructive sleep apnea (adult) (pediatric): Secondary | ICD-10-CM

## 2022-07-14 DIAGNOSIS — D582 Other hemoglobinopathies: Secondary | ICD-10-CM

## 2022-07-14 DIAGNOSIS — I10 Essential (primary) hypertension: Secondary | ICD-10-CM

## 2022-07-14 DIAGNOSIS — Z79899 Other long term (current) drug therapy: Secondary | ICD-10-CM

## 2022-07-14 DIAGNOSIS — I712 Thoracic aortic aneurysm, without rupture, unspecified: Secondary | ICD-10-CM

## 2022-07-14 NOTE — Procedures (Signed)
SLEEP STUDY REPORT Patient Information Study Date: 07/10/2022 Patient Name: Erin Kaufman Patient ID: [id number] Birth Date: 19-Dec-1970 Age: 52 Gender: Female BMI: 32.9 (W=174 lb, H=5' 1'') Stopbang: 6 Referring Physician: Dietrich Pates, MD  TEST DESCRIPTION:  Home sleep apnea testing was completed using the WatchPat, a Type 1 device, utilizing peripheral arterial tonometry (PAT), chest movement, actigraphy, pulse oximetry, pulse rate, body position and snore.  AHI was calculated with apnea and hypopnea using valid sleep time as the denominator. RDI includes apneas, hypopneas, and RERAs.  The data acquired and the scoring of sleep and all associated events were performed in accordance with the recommended standards and specifications as outlined in the AASM Manual for the Scoring of Sleep and Associated Events 2.2.0 (2015).  FINDINGS:  1.  Moderate Obstructive Sleep Apnea with AHI 18.4/hr.   2.  No Central Sleep Apnea with pAHIc 0.5/hr.  3.  Oxygen desaturations as low as 78%.  4.  Severe snoring was present. O2 sats were < 88% for 109.1 min.  5.  Total sleep time was 6 hrs and 40 min.  6.  30% of total sleep time was spent in REM sleep.   7.  Normal sleep onset latency at 10 min  8.  Prolonged REM sleep onset latency at 115 min.   9.  Total awakenings were9 .  10. Arrhythmia detection: None.  DIAGNOSIS:   Moderate Obstructive Sleep Apnea (G47.33) Nocturnal Hypoxemia  RECOMMENDATIONS:   1.  Clinical correlation of these findings is necessary.  The decision to treat obstructive sleep apnea (OSA) is usually based on the presence of apnea symptoms or the presence of associated medical conditions such as Hypertension, Congestive Heart Failure, Atrial Fibrillation or Obesity.  The most common symptoms of OSA are snoring, gasping for breath while sleeping, daytime sleepiness and fatigue.   2.  Initiating apnea therapy is recommended given the presence of symptoms and/or  associated conditions. Recommend proceeding with one of the following:     a.  Auto-CPAP therapy with a pressure range of 5-20cm H2O.     b.  An oral appliance (OA) that can be obtained from certain dentists with expertise in sleep medicine.  These are primarily of use in non-obese patients with mild and moderate disease.     c.  An ENT consultation which may be useful to look for specific causes of obstruction and possible treatment options.     d.  If patient is intolerant to PAP therapy, consider referral to ENT for evaluation for hypoglossal nerve stimulator.   3.  Close follow-up is necessary to ensure success with CPAP or oral appliance therapy for maximum benefit.  4.  A follow-up oximetry study on CPAP is recommended to assess the adequacy of therapy and determine the need for supplemental oxygen or the potential need for Bi-level therapy.  An arterial blood gas to determine the adequacy of baseline ventilation and oxygenation should also be considered.  5.  Healthy sleep recommendations include:  adequate nightly sleep (normal 7-9 hrs/night), avoidance of caffeine after noon and alcohol near bedtime, and maintaining a sleep environment that is cool, dark and quiet.  6.  Weight loss for overweight patients is recommended.  Even modest amounts of weight loss can significantly improve the severity of sleep apnea.  7.  Snoring recommendations include:  weight loss where appropriate, side sleeping, and avoidance of alcohol before bed.  8.  Operation of motor vehicle should not be performed when sleepy.  Signature:   Armanda Magic, MD; [facility]; Diplomat, American Board of Sleep Medicine Electronically Signed: 07/14/2022 5:50:43 PM

## 2022-07-19 ENCOUNTER — Other Ambulatory Visit: Payer: Self-pay | Admitting: Internal Medicine

## 2022-08-02 ENCOUNTER — Telehealth: Payer: Self-pay | Admitting: *Deleted

## 2022-08-02 DIAGNOSIS — I10 Essential (primary) hypertension: Secondary | ICD-10-CM

## 2022-08-02 DIAGNOSIS — G4733 Obstructive sleep apnea (adult) (pediatric): Secondary | ICD-10-CM

## 2022-08-02 NOTE — Telephone Encounter (Signed)
-----   Message from Brunetta Genera, [place] sent at 08/01/2022 10:18 AM EDT -----  ----- Message ----- From: Quintella Reichert, MD Sent: 07/14/2022   5:52 PM EDT To: Cv Div Sleep Studies  Please let patient know that they have sleep apnea.  Recommend therapeutic CPAP titration for treatment of patient's sleep disordered breathing.  If unable to perform an in lab titration then initiate ResMed auto CPAP from 4 to 15cm H2O with heated humidity and mask of choice and overnight pulse ox on CPAP.

## 2022-08-02 NOTE — Telephone Encounter (Signed)
The patient has been notified of the result and verbalized understanding.  All questions (if any) were answered. Latrelle Dodrill, CMA 08/02/2022 11:41 AM    [facility] Mordecai Maes

## 2022-08-09 ENCOUNTER — Encounter (INDEPENDENT_AMBULATORY_CARE_PROVIDER_SITE_OTHER): Payer: No Typology Code available for payment source | Admitting: Cardiology

## 2022-08-09 DIAGNOSIS — G4733 Obstructive sleep apnea (adult) (pediatric): Secondary | ICD-10-CM

## 2022-08-15 ENCOUNTER — Other Ambulatory Visit: Payer: Self-pay | Admitting: Internal Medicine

## 2022-09-08 NOTE — Telephone Encounter (Addendum)
[employer] DENIED THE TITRATION APPROVED A HOME TEST.  Per Dr Mayford Knife, If unable to perform an in lab titration then initiate ResMed auto CPAP from 4 to 15cm H2O with heated humidity and mask of choice and overnight pulse ox on CPAP.   Upon patient request DME selection is [facility]. Patient understands he will be contacted by [facility] to set up his cpap. Patient understands to call if [facility] does not contact him with new setup in a timely manner. Patient understands they will be called once confirmation has been received from [facility]/ that they have received their new machine to schedule 10 week follow up appointment.   [facility] notified of new cpap order  Please add to airview Patient was grateful for the call and thanked me.

## 2022-09-08 NOTE — Addendum Note (Signed)
Addended by: Reesa Chew on: 09/08/2022 05:49 PM   Modules accepted: Orders

## 2022-10-11 ENCOUNTER — Other Ambulatory Visit: Payer: Self-pay | Admitting: Obstetrics and Gynecology

## 2022-10-11 DIAGNOSIS — Z720 Tobacco use: Secondary | ICD-10-CM

## 2022-11-02 ENCOUNTER — Ambulatory Visit
Admission: RE | Admit: 2022-11-02 | Discharge: 2022-11-02 | Disposition: A | Discharge status: Home / Self Care | Payer: No Typology Code available for payment source | Source: Ambulatory Visit | Attending: Obstetrics and Gynecology | Admitting: Obstetrics and Gynecology

## 2022-11-02 DIAGNOSIS — Z720 Tobacco use: Secondary | ICD-10-CM

## 2023-01-11 ENCOUNTER — Ambulatory Visit: Payer: No Typology Code available for payment source | Admitting: Cardiology

## 2023-03-23 ENCOUNTER — Telehealth: Payer: Self-pay

## 2023-03-23 NOTE — Telephone Encounter (Signed)
 Patient set up for virtual visit on 03/26/23, call to patient to advise she does not have active MyChart account. Left message per DPR reminding patient of appointment and advising she will need active MyChart account to participate in visit. Gave MyChart customer service # in case she needs assistance resetting password or accessing account. Advised patient to call our office if any questions.

## 2023-03-24 NOTE — Progress Notes (Signed)
 SLEEP MEDICINE VIRTUAL CONSULT NOTE via Video Note   Because of Bobbi P Trejos's co-morbid illnesses, she is at least at moderate risk for complications without adequate follow up.  This format is felt to be most appropriate for this patient at this time.  All issues noted in this document were discussed and addressed.  A limited physical exam was performed with this format.  Please refer to the patient's chart for her consent to telehealth for [facility].  Date:  03/26/2023   ID:  Erin Kaufman, DOB 10/27/1970, MRN [id number] The patient was identified using 2 identifiers.  Patient Location: Home Provider Location: Home Office   PCP:  [name], MD   [facility] Providers Cardiologist:  [name], MD     Evaluation Performed:  New Patient Evaluation  Chief Complaint:  OSA  History of Present Illness:    Erin Kaufman is a 53 y.o. female who is being seen today for the evaluation of OSA at the request of [name], MD.  Erin Kaufman is a 53 y.o. female with a hx of GERD, HH, HLD, HTN who recently complained to Dr. Gull that she was not sleeping well.  She would snore at night and felt tired during the day.  She underwent HST on 08/09/22 and showed moderate OSA with an AHI of 18.4/hr and nocturnal hypoxemia and was started on auto CPAP from 4 to 15cm H2O.  She is now referred for Sleep Medicine consult to establish sleep care and treatment of OSA.  She is doing well with her PAP device and thinks that she has gotten used to it.  She tolerates the full face mask but has been taking it off in her sleep.  She feels the pressure is adequate.  Since going on PAP she feels rested in the am and has no significant daytime sleepiness.  She denies any significant mouth or nasal dryness or nasal congestion.  She does not think that she snores.     Past Medical History:  Diagnosis Date   Anxiety    Blood transfusion without reported diagnosis     GERD (gastroesophageal reflux disease)    on meds   Hiatal hernia    Hyperlipidemia    on meds   Hypertension    on meds   Thyroid  disease    on meds   Past Surgical History:  Procedure Laterality Date   C SECTIONS     X3   COLONOSCOPY WITH PROPOFOL  N/A 02/26/2021   Procedure: COLONOSCOPY WITH PROPOFOL ;  Surgeon: Leigh Elspeth SHAUNNA, MD;  Location: [facility] ENDOSCOPY;  Service: Gastroenterology;  Laterality: N/A;   FINGER SURGERY Left 2019   3rd and 4th metatarsal fracture   HEMOSTASIS CLIP PLACEMENT  02/26/2021   Procedure: HEMOSTASIS CLIP PLACEMENT;  Surgeon: Leigh Elspeth SHAUNNA, MD;  Location: [facility] ENDOSCOPY;  Service: Gastroenterology;;   UPPER GASTROINTESTINAL ENDOSCOPY  2010   Dr. Jeffie   VAGINAL HYSTERECTOMY  2007   WISDOM TOOTH EXTRACTION  1988     Current Meds  Medication Sig   cholecalciferol (VITAMIN D ) 1000 units tablet Take 1,000 Units by mouth daily.   lisinopril  (ZESTRIL ) 10 MG tablet TAKE 1 TABLET BY MOUTH EVERY DAY   NEXIUM  40 MG capsule TAKE 1 CAPSULE (40 MG TOTAL) BY MOUTH DAILY.   SYNTHROID  125 MCG tablet Take 1 tablet (125 mcg total) by mouth daily.     Allergies:   Penicillins and Aspirin   Social History   Tobacco Use   Smoking status: Every Day    Current packs/day: 1.00    Average packs/day: 1 pack/day for 25.0 years (25.0 ttl pk-yrs)    Types: Cigarettes    Passive exposure: Never   Smokeless tobacco: Never   Tobacco comments:    form given 03-20-12  Vaping Use   Vaping status: Never Used  Substance Use Topics   Alcohol use: Yes    Alcohol/week: 4.0 - 6.0 standard drinks of alcohol    Types: 4 - 6 Standard drinks or equivalent per week    Comment: socially   Drug use: Never     Family Hx: The patient's family history includes Brain cancer in her mother; Colon polyps in her father; Diabetes in her mother; Heart disease in her mother; Hypertension in her mother; Lymphoma in her mother; Prostate cancer in her father. There is no history of  Colon cancer, Esophageal cancer, Rectal cancer, Stomach cancer, Crohn's disease, or Ulcerative colitis.  ROS:   Please see the history of present illness.     All other systems reviewed and are negative.   Prior Sleep studies:   The following studies were reviewed today:  PAP compliance download and HST  Labs/Other Tests and Data Reviewed:     Recent Labs: 07/04/2022: BUN 9; Creatinine, Ser 0.75; Hemoglobin 17.3; Platelets 314; Potassium 5.0; Sodium 138    Wt Readings from Last 3 Encounters:  03/26/23 162 lb (73.5 kg)  06/22/22 175 lb 0.3 oz (79.4 kg)  02/17/22 176 lb 3.2 oz (79.9 kg)     Risk Assessment/Calculations:          Objective:    Vital Signs:  Ht 5' (1.524 m)   Wt 162 lb (73.5 kg)   BMI 31.64 kg/m    VITAL SIGNS:  reviewed GEN:  no acute distress EYES:  sclerae anicteric, EOMI - Extraocular Movements Intact RESPIRATORY:  normal respiratory effort, symmetric expansion CARDIOVASCULAR:  no peripheral edema SKIN:  no rash, lesions or ulcers. MUSCULOSKELETAL:  no obvious deformities. NEURO:  alert and oriented x 3, no obvious focal deficit PSYCH:  normal affect  ASSESSMENT & PLAN:    OSA - The patient is tolerating PAP therapy well without any problems. The PAP download performed by his DME was personally reviewed and interpreted by me today and showed an AHI of 6.4/hr on auto CPAP from 4 to 15 cm H2O with 10% compliance in using more than 4 hours nightly.  The patient has been using and benefiting from PAP use and will continue to benefit from therapy.  -her download shows a large mask leak likely related to not changing the cushion since she got the mask -encouraged her to be more compliant with her device>>she travels a lot for work and does not like to take it with her>>encouraged her to take it on trips or get a travel CPAP to use    HTN -BP controlled at home -continue prescription drug management with Lisinopril  10mg  daily with PRN refills  Time:    Today, I have spent 20 minutes with the patient with telehealth technology discussing the above problems.     Medication Adjustments/Labs and Tests Ordered: Current medicines are reviewed at length with the patient today.  Concerns regarding medicines are outlined above.   Tests Ordered: No orders of the defined types were placed in this encounter.   Medication Changes: No orders of the defined types were placed in this encounter.   Follow Up:  In Person in 8 week(s)  Signed, Wilbert Bihari, MD  03/26/2023 8:32 AM    [facility]

## 2023-03-26 ENCOUNTER — Ambulatory Visit: Payer: 59 | Attending: Cardiology | Admitting: Cardiology

## 2023-03-26 VITALS — Ht 60.0 in | Wt 162.0 lb

## 2023-03-26 DIAGNOSIS — G4733 Obstructive sleep apnea (adult) (pediatric): Secondary | ICD-10-CM

## 2023-03-26 DIAGNOSIS — I10 Essential (primary) hypertension: Secondary | ICD-10-CM

## 2023-03-26 NOTE — Patient Instructions (Signed)
 Medication Instructions:  Your physician recommends that you continue on your current medications as directed. Please refer to the Current Medication list given to you today.  *If you need a refill on your cardiac medications before your next appointment, please call your pharmacy*   Lab Work: None.  If you have labs (blood work) drawn today and your tests are completely normal, you will receive your results only by: MyChart Message (if you have MyChart) OR A paper copy in the mail If you have any lab test that is abnormal or we need to change your treatment, we will call you to review the results.   Testing/Procedures: None.   Follow-Up:  Your next appointment is 05/31/23 at 9:20 AM with:     Provider:   Dr. Wilbert Bihari, MD

## 2023-05-29 ENCOUNTER — Telehealth: Payer: Self-pay | Admitting: *Deleted

## 2023-05-29 DIAGNOSIS — I10 Essential (primary) hypertension: Secondary | ICD-10-CM

## 2023-05-29 DIAGNOSIS — G4733 Obstructive sleep apnea (adult) (pediatric): Secondary | ICD-10-CM

## 2023-05-29 NOTE — Telephone Encounter (Signed)
 Called patient about her cpap usage and patient states she has been having cpap problems and did not know who to call. Patient was provided with the number to her dme and to our office for future questions and help. Patient was grateful for the call and thanked me.

## 2023-05-31 ENCOUNTER — Encounter: Payer: Self-pay | Admitting: Cardiology

## 2023-05-31 ENCOUNTER — Ambulatory Visit: Payer: 59 | Attending: Cardiology | Admitting: Cardiology

## 2023-05-31 VITALS — Ht 60.0 in | Wt 155.0 lb

## 2023-05-31 DIAGNOSIS — G4733 Obstructive sleep apnea (adult) (pediatric): Secondary | ICD-10-CM | POA: Diagnosis not present

## 2023-05-31 DIAGNOSIS — I10 Essential (primary) hypertension: Secondary | ICD-10-CM

## 2023-05-31 NOTE — Progress Notes (Signed)
 SLEEP MEDICINE VIRTUAL  NOTE via Video Note   Because of Erin Kaufman's co-morbid illnesses, she is at least at moderate risk for complications without adequate follow up.  This format is felt to be most appropriate for this patient at this time.  All issues noted in this document were discussed and addressed.  A limited physical exam was performed with this format.  Please refer to the patient's chart for her consent to telehealth for [facility].  Date:  05/31/2023   ID:  Erin Kaufman, DOB 1970-11-23, MRN [id number] The patient was identified using 2 identifiers.  Patient Location: Home Provider Location: Home Office   PCP:  Richardean Chimera, MD   [facility] Providers Cardiologist:  Dietrich Pates, MD     Evaluation Performed:  New Patient Evaluation  Chief Complaint:  OSA  History of Present Illness:    Erin Kaufman is a 53 y.o. female  with a hx of GERD, HH, HLD, HTN who recently complained to Dr. Tenny Craw that she was not sleeping well.  She would snore at night and felt tired during the day.  She underwent HST on 08/09/22 and showed moderate OSA with an AHI of 18.4/hr and nocturnal hypoxemia and was started on auto CPAP from 4 to 15cm H2O.  She is now referred for Sleep Medicine consult to establish sleep care and treatment of OSA.  When I saw her last she was having a very large mask leak and she changed out the cushion which she had not changed out yet and that fixed the mask leak.  Now over the past week she is having a problem with the device not blowing air.   She has doing well with her PAP device and thinks that she has gotten used to it.  She tolerates the full face mask but has been taking it off in her sleep.  She feels the pressure is adequate.  Since going on PAP she feels rested in the am and has no significant daytime sleepiness.  She denies any significant mouth or nasal dryness or nasal congestion.  She does not think that she snores.      Past Medical History:  Diagnosis Date   Anxiety    Blood transfusion without reported diagnosis    GERD (gastroesophageal reflux disease)    on meds   Hiatal hernia    Hyperlipidemia    on meds   Hypertension    on meds   Thyroid disease    on meds   Past Surgical History:  Procedure Laterality Date   C SECTIONS     X3   COLONOSCOPY WITH PROPOFOL N/A 02/26/2021   Procedure: COLONOSCOPY WITH PROPOFOL;  Surgeon: Benancio Deeds, MD;  Location: [facility] ENDOSCOPY;  Service: Gastroenterology;  Laterality: N/A;   FINGER SURGERY Left 2019   3rd and 4th metatarsal fracture   HEMOSTASIS CLIP PLACEMENT  02/26/2021   Procedure: HEMOSTASIS CLIP PLACEMENT;  Surgeon: Benancio Deeds, MD;  Location: [facility] ENDOSCOPY;  Service: Gastroenterology;;   UPPER GASTROINTESTINAL ENDOSCOPY  2010   Dr. Marshell Garfinkel   VAGINAL HYSTERECTOMY  2007   WISDOM TOOTH EXTRACTION  1988     Current Meds  Medication Sig   cetirizine (ZYRTEC) 10 MG tablet Take 10 mg by mouth daily.   cholecalciferol (VITAMIN D) 1000 units tablet Take 1,000 Units by mouth daily.   lisinopril (ZESTRIL) 10 MG tablet TAKE 1 TABLET BY MOUTH EVERY DAY   NEXIUM  40 MG capsule TAKE 1 CAPSULE (40 MG TOTAL) BY MOUTH DAILY.   SYNTHROID 125 MCG tablet Take 1 tablet (125 mcg total) by mouth daily.     Allergies:   Penicillins and Aspirin   Social History   Tobacco Use   Smoking status: Every Day    Current packs/day: 1.00    Average packs/day: 1 pack/day for 25.0 years (25.0 ttl pk-yrs)    Types: Cigarettes    Passive exposure: Never   Smokeless tobacco: Never   Tobacco comments:    form given 03-20-12  Vaping Use   Vaping status: Never Used  Substance Use Topics   Alcohol use: Yes    Alcohol/week: 4.0 - 6.0 standard drinks of alcohol    Types: 4 - 6 Standard drinks or equivalent per week    Comment: socially   Drug use: Never     Family Hx: The patient's family history includes Brain cancer in her mother; Colon polyps  in her father; Diabetes in her mother; Heart disease in her mother; Hypertension in her mother; Lymphoma in her mother; Prostate cancer in her father. There is no history of Colon cancer, Esophageal cancer, Rectal cancer, Stomach cancer, Crohn's disease, or Ulcerative colitis.  ROS:   Please see the history of present illness.     All other systems reviewed and are negative.   Prior Sleep studies:   The following studies were reviewed today:  PAP compliance download and HST  Labs/Other Tests and Data Reviewed:     Recent Labs: 07/04/2022: BUN 9; Creatinine, Ser 0.75; Hemoglobin 17.3; Platelets 314; Potassium 5.0; Sodium 138    Wt Readings from Last 3 Encounters:  05/31/23 155 lb (70.3 kg)  03/26/23 162 lb (73.5 kg)  06/22/22 175 lb 0.3 oz (79.4 kg)     Risk Assessment/Calculations:          Objective:    Vital Signs:  Ht 5' (1.524 m)   Wt 155 lb (70.3 kg)   SpO2 97%   BMI 30.27 kg/m   Well nourished, well developed female in no acute distress. Well appearing, alert and conversant, regular work of breathing,  good skin color  Eyes- anicteric mouth- oral mucosa is pink  neuro- grossly intact skin- no apparent rash or lesions or cyanosis ASSESSMENT & PLAN:    OSA - The patient is tolerating PAP therapy well without any problems.  The patient has been using and benefiting from PAP use and will continue to benefit from therapy.  -her device has not been blowing out air for the past 3 weeks and she has not been able to use it -I will get her an appt ASAP with the DME to look at her device -I will get a download in 4 weeks  HTN -BP has been controlled at home -Continue prescription drug management with lisinopril 10 mg daily with as needed refills -I have personally reviewed and interpreted outside labs performed by patient's PCP which showed serum creatinine 0.75 and potassium 5 on 07/04/2022  Time:   Today, I have spent 20 minutes with the patient with telehealth  technology discussing the above problems.     Medication Adjustments/Labs and Tests Ordered: Current medicines are reviewed at length with the patient today.  Concerns regarding medicines are outlined above.   Tests Ordered: No orders of the defined types were placed in this encounter.   Medication Changes: No orders of the defined types were placed in this encounter.   Follow Up: 1 year  Signed, Armanda Magic, MD  05/31/2023 9:20 AM     [facility]

## 2023-05-31 NOTE — Telephone Encounter (Signed)
 Patient needs a DME visit ASAP because she has not been able to use her device for the past 3 weeks as it is not working at all.  Order sent to [employer] via community message.

## 2023-05-31 NOTE — Patient Instructions (Signed)
 Follow-Up: At [facility], you and your health needs are our priority.  As part of our continuing mission to provide you with exceptional heart care, we have created designated Provider Care Teams.  These Care Teams include your primary Cardiologist (physician) and Advanced Practice Providers (APPs -  Physician Assistants and Nurse Practitioners) who all work together to provide you with the care you need, when you need it.  We recommend signing up for the patient portal called "MyChart".  Sign up information is provided on this After Visit Summary.  MyChart is used to connect with patients for Virtual Visits (Telemedicine).  Patients are able to view lab/test results, encounter notes, upcoming appointments, etc.  Non-urgent messages can be sent to your provider as well.   To learn more about what you can do with MyChart, go to [email or web address].    Your next appointment:   1 year(s)  Provider:   Armanda Magic, MD   Other Instructions   1st Floor: - Lobby - Registration  - Pharmacy  - Lab - Cafe  2nd Floor: - PV Lab - Diagnostic Testing (echo, CT, nuclear med)  3rd Floor: - Vacant  4th Floor: - TCTS (cardiothoracic surgery) - AFib Clinic - Structural Heart Clinic - Vascular Surgery  - Vascular Ultrasound  5th Floor: - HeartCare Cardiology (general and EP) - Clinical Pharmacy for coumadin, hypertension, lipid, weight-loss medications, and med management appointments    Valet parking services will be available as well.

## 2023-05-31 NOTE — Addendum Note (Signed)
 Addended by: Reesa Chew on: 05/31/2023 04:49 PM   Modules accepted: Orders

## 2023-05-31 NOTE — Progress Notes (Signed)
  Patient Consent for Virtual Visit        Erin Kaufman has provided verbal consent on 05/31/2023 for a virtual visit (video or telephone).   CONSENT FOR VIRTUAL VISIT FOR:  Erin Kaufman  By participating in this virtual visit I agree to the following:  I hereby voluntarily request, consent and authorize [facility] and its employed or contracted physicians, physician assistants, nurse practitioners or other licensed health care professionals (the Practitioner), to provide me with telemedicine health care services (the "Services") as deemed necessary by the treating Practitioner. I acknowledge and consent to receive the Services by the Practitioner via telemedicine. I understand that the telemedicine visit will involve communicating with the Practitioner through live audiovisual communication technology and the disclosure of certain medical information by electronic transmission. I acknowledge that I have been given the opportunity to request an in-person assessment or other available alternative prior to the telemedicine visit and am voluntarily participating in the telemedicine visit.  I understand that I have the right to withhold or withdraw my consent to the use of telemedicine in the course of my care at any time, without affecting my right to future care or treatment, and that the Practitioner or I may terminate the telemedicine visit at any time. I understand that I have the right to inspect all information obtained and/or recorded in the course of the telemedicine visit and may receive copies of available information for a reasonable fee.  I understand that some of the potential risks of receiving the Services via telemedicine include:  Delay or interruption in medical evaluation due to technological equipment failure or disruption; Information transmitted may not be sufficient (e.g. poor resolution of images) to allow for appropriate medical decision making by the  Practitioner; and/or  In rare instances, security protocols could fail, causing a breach of personal health information.  Furthermore, I acknowledge that it is my responsibility to provide information about my medical history, conditions and care that is complete and accurate to the best of my ability. I acknowledge that Practitioner's advice, recommendations, and/or decision may be based on factors not within their control, such as incomplete or inaccurate data provided by me or distortions of diagnostic images or specimens that may result from electronic transmissions. I understand that the practice of medicine is not an exact science and that Practitioner makes no warranties or guarantees regarding treatment outcomes. I acknowledge that a copy of this consent can be made available to me via my patient portal [facility] MyChart), or I can request a printed copy by calling the office of [facility].    I understand that my insurance will be billed for this visit.   I have read or had this consent read to me. I understand the contents of this consent, which adequately explains the benefits and risks of the Services being provided via telemedicine.  I have been provided ample opportunity to ask questions regarding this consent and the Services and have had my questions answered to my satisfaction. I give my informed consent for the services to be provided through the use of telemedicine in my medical care

## 2023-06-03 ENCOUNTER — Observation Stay (HOSPITAL_COMMUNITY)
Admission: EM | Admit: 2023-06-03 | Discharge: 2023-06-03 | Disposition: A | Discharge status: Home / Self Care | Attending: Emergency Medicine | Admitting: Emergency Medicine

## 2023-06-03 ENCOUNTER — Encounter (HOSPITAL_COMMUNITY): Payer: Self-pay | Admitting: Emergency Medicine

## 2023-06-03 ENCOUNTER — Other Ambulatory Visit: Payer: Self-pay

## 2023-06-03 DIAGNOSIS — K922 Gastrointestinal hemorrhage, unspecified: Principal | ICD-10-CM | POA: Diagnosis present

## 2023-06-03 DIAGNOSIS — F17210 Nicotine dependence, cigarettes, uncomplicated: Secondary | ICD-10-CM | POA: Diagnosis not present

## 2023-06-03 DIAGNOSIS — G4733 Obstructive sleep apnea (adult) (pediatric): Secondary | ICD-10-CM | POA: Diagnosis present

## 2023-06-03 DIAGNOSIS — I10 Essential (primary) hypertension: Secondary | ICD-10-CM | POA: Diagnosis present

## 2023-06-03 DIAGNOSIS — E871 Hypo-osmolality and hyponatremia: Secondary | ICD-10-CM | POA: Insufficient documentation

## 2023-06-03 DIAGNOSIS — K625 Hemorrhage of anus and rectum: Secondary | ICD-10-CM | POA: Diagnosis present

## 2023-06-03 DIAGNOSIS — E039 Hypothyroidism, unspecified: Secondary | ICD-10-CM | POA: Diagnosis not present

## 2023-06-03 DIAGNOSIS — Z7982 Long term (current) use of aspirin: Secondary | ICD-10-CM | POA: Diagnosis not present

## 2023-06-03 DIAGNOSIS — Z79899 Other long term (current) drug therapy: Secondary | ICD-10-CM | POA: Insufficient documentation

## 2023-06-03 DIAGNOSIS — K648 Other hemorrhoids: Secondary | ICD-10-CM

## 2023-06-03 LAB — COMPREHENSIVE METABOLIC PANEL
ALT: 14 U/L (ref 0–44)
AST: 15 U/L (ref 15–41)
Albumin: 3.8 g/dL (ref 3.5–5.0)
Alkaline Phosphatase: 59 U/L (ref 38–126)
Anion gap: 13 (ref 5–15)
BUN: 10 mg/dL (ref 6–20)
CO2: 20 mmol/L — ABNORMAL LOW (ref 22–32)
Calcium: 8.6 mg/dL — ABNORMAL LOW (ref 8.9–10.3)
Chloride: 95 mmol/L — ABNORMAL LOW (ref 98–111)
Creatinine, Ser: 0.79 mg/dL (ref 0.44–1.00)
GFR, Estimated: >60 mL/min (ref >60)
Glucose, Bld: 105 mg/dL — ABNORMAL HIGH (ref 70–99)
Potassium: 3.5 mmol/L (ref 3.5–5.1)
Sodium: 128 mmol/L — ABNORMAL LOW (ref 135–145)
Total Bilirubin: 0.5 mg/dL (ref 0.0–1.2)
Total Protein: 7.2 g/dL (ref 6.5–8.1)

## 2023-06-03 LAB — CBC
HCT: 45.6 % (ref 36.0–46.0)
HCT: 42 % (ref 36.0–46.0)
Hemoglobin: 15.7 g/dL — ABNORMAL HIGH (ref 12.0–15.0)
Hemoglobin: 14.5 g/dL (ref 12.0–15.0)
MCH: 34.1 pg — ABNORMAL HIGH (ref 26.0–34.0)
MCH: 34.3 pg — ABNORMAL HIGH (ref 26.0–34.0)
MCHC: 34.4 g/dL (ref 30.0–36.0)
MCHC: 34.5 g/dL (ref 30.0–36.0)
MCV: 98.9 fL (ref 80.0–100.0)
MCV: 99.3 fL (ref 80.0–100.0)
Platelets: 261 10*3/uL (ref 150–400)
Platelets: 242 10*3/uL (ref 150–400)
RBC: 4.61 MIL/uL (ref 3.87–5.11)
RBC: 4.23 MIL/uL (ref 3.87–5.11)
RDW: 14.5 % (ref 11.5–15.5)
RDW: 14.4 % (ref 11.5–15.5)
WBC: 10 10*3/uL (ref 4.0–10.5)
WBC: 5.8 10*3/uL (ref 4.0–10.5)
nRBC: 0 % (ref 0.0–0.2)
nRBC: 0 % (ref 0.0–0.2)

## 2023-06-03 LAB — BASIC METABOLIC PANEL
Anion gap: 9 (ref 5–15)
BUN: 7 mg/dL (ref 6–20)
CO2: 23 mmol/L (ref 22–32)
Calcium: 8.4 mg/dL — ABNORMAL LOW (ref 8.9–10.3)
Chloride: 101 mmol/L (ref 98–111)
Creatinine, Ser: 0.63 mg/dL (ref 0.44–1.00)
GFR, Estimated: >60 mL/min (ref >60)
Glucose, Bld: 87 mg/dL (ref 70–99)
Potassium: 4.3 mmol/L (ref 3.5–5.1)
Sodium: 133 mmol/L — ABNORMAL LOW (ref 135–145)

## 2023-06-03 LAB — TYPE AND SCREEN
ABO/RH(D): O POS
Antibody Screen: NEGATIVE

## 2023-06-03 LAB — POC OCCULT BLOOD, ED: Fecal Occult Bld: POSITIVE — AB

## 2023-06-03 LAB — HCG, SERUM, QUALITATIVE: Preg, Serum: NEGATIVE

## 2023-06-03 MED ORDER — ONDANSETRON HCL 4 MG/2ML IJ SOLN: 4.0000 mg | Freq: Four times a day (QID) | INTRAMUSCULAR | Status: DC | PRN

## 2023-06-03 MED ORDER — ACETAMINOPHEN 325 MG PO TABS: 650.0000 mg | ORAL_TABLET | Freq: Four times a day (QID) | ORAL | Status: DC | PRN

## 2023-06-03 MED ORDER — PANTOPRAZOLE SODIUM 40 MG PO TBEC
40.0000 mg | DELAYED_RELEASE_TABLET | Freq: Every day | ORAL | Status: DC
  Administered 2023-06-03: 40 mg via ORAL
  Filled 2023-06-03: qty 1

## 2023-06-03 MED ORDER — ACETAMINOPHEN 650 MG RE SUPP: 650.0000 mg | Freq: Four times a day (QID) | RECTAL | Status: DC | PRN

## 2023-06-03 MED ORDER — SODIUM CHLORIDE 0.9 % IV SOLN: INTRAVENOUS | Status: DC

## 2023-06-03 MED ORDER — LEVOTHYROXINE SODIUM 25 MCG PO TABS
125.0000 ug | ORAL_TABLET | Freq: Every day | ORAL | Status: DC
  Administered 2023-06-03: 125 ug via ORAL
  Filled 2023-06-03: qty 1

## 2023-06-03 MED ORDER — SODIUM CHLORIDE 0.9% FLUSH
3.0000 mL | Freq: Two times a day (BID) | INTRAVENOUS | Status: DC
  Administered 2023-06-03: 3 mL via INTRAVENOUS

## 2023-06-03 NOTE — ED Provider Notes (Signed)
 [place] EMERGENCY DEPARTMENT AT [facility] Provider Note   CSN: [id number] Arrival date & time: 06/03/23  0132     History  Chief Complaint  Patient presents with   Rectal Bleeding    Erin Kaufman is a 53 y.o. female status post GI bleed with 16 hemostatic clips placed also requiring 4 units of blood presented for rectal bleeding that began an hour prior to arrival.  Patient states that she is having dark red blood where his family states she is having bright red blood.  Patient has any nausea vomiting along with melena or abdominal pain.  Patient is not on any blood thinners nor does she have any bleeding disorders.  Patient sees Dr. Marina Goodell for GI at [facility].  Patient denies chest pain or shortness of breath or skin color changes  Home Medications Prior to Admission medications   Medication Sig Start Date End Date Taking? Authorizing Provider  cetirizine (ZYRTEC) 10 MG tablet Take 10 mg by mouth daily as needed for allergies. 03/20/23  Yes [provider]  cholecalciferol (VITAMIN D) 1000 units tablet Take 1,000 Units by mouth daily.   Yes [provider]  lisinopril (ZESTRIL) 10 MG tablet TAKE 1 TABLET BY MOUTH EVERY DAY 07/19/22  Yes Pricilla Riffle, MD  NEXIUM 40 MG capsule TAKE 1 CAPSULE (40 MG TOTAL) BY MOUTH DAILY. 03/29/13  Yes Hilarie Fredrickson, MD  SYNTHROID 125 MCG tablet Take 1 tablet (125 mcg total) by mouth daily. 11/27/12  Yes Darnell Level, MD      Allergies    Penicillins and Aspirin    Review of Systems   Review of Systems  Physical Exam Updated Vital Signs BP 106/67 (BP Location: Left Arm)   Pulse 81   Temp 97.9 F (36.6 C) (Oral)   Resp 19   SpO2 99%  Physical Exam Constitutional:      General: She is not in acute distress. Cardiovascular:     Rate and Rhythm: Normal rate and regular rhythm.     Pulses: Normal pulses.     Heart sounds: Normal heart sounds.  Pulmonary:     Effort: Pulmonary effort is normal. No respiratory  distress.     Breath sounds: Normal breath sounds.  Abdominal:     General: There is no distension.     Palpations: Abdomen is soft.     Tenderness: There is no abdominal tenderness. There is no guarding or rebound.  Genitourinary:    Comments: Chaperone: Micah, EMT Mix of dark red and bright red blood however no obvious blood clots or melena noted Rectal tone intact Skin:    General: Skin is warm and dry.     Capillary Refill: Capillary refill takes less than 2 seconds.     Coloration: Skin is not pale.  Neurological:     Mental Status: She is alert and oriented to person, place, and time.     ED Results / Procedures / Treatments   Labs (all labs ordered are listed, but only abnormal results are displayed) Labs Reviewed  COMPREHENSIVE METABOLIC PANEL - Abnormal; Notable for the following components:      Result Value   Sodium 128 (*)    Chloride 95 (*)    CO2 20 (*)    Glucose, Bld 105 (*)    Calcium 8.6 (*)    All other components within normal limits  CBC - Abnormal; Notable for the following components:   Hemoglobin 15.7 (*)  MCH 34.1 (*)    All other components within normal limits  POC OCCULT BLOOD, ED - Abnormal; Notable for the following components:   Fecal Occult Bld POSITIVE (*)    All other components within normal limits  HCG, SERUM, QUALITATIVE  CBC  CBC  SODIUM, URINE, RANDOM  CREATININE, URINE, RANDOM  TYPE AND SCREEN    EKG None  Radiology No results found.  Procedures Procedures    Medications Ordered in ED Medications  levothyroxine (SYNTHROID) tablet 125 mcg (has no administration in time range)  pantoprazole (PROTONIX) EC tablet 40 mg (has no administration in time range)  sodium chloride flush (NS) 0.9 % injection 3 mL (has no administration in time range)  acetaminophen (TYLENOL) tablet 650 mg (has no administration in time range)    Or  acetaminophen (TYLENOL) suppository 650 mg (has no administration in time range)  0.9 %   sodium chloride infusion (has no administration in time range)  ondansetron (ZOFRAN) injection 4 mg (has no administration in time range)    ED Course/ Medical Decision Making/ A&P                                 Medical Decision Making Amount and/or Complexity of Data Reviewed Labs: ordered.  Risk Decision regarding hospitalization.   JOURNEI THOMASSEN 53 y.o. presented today for GIB. Working DDx that I considered at this time includes, but not limited to, Esophagitis, Mallory Weiss/Boerhaave, Variceal bleeding, PUD/gastritis/ulcers, diverticular bleed, colon cancer, rectal bleed, internal/external hemorrhoids  R/o DDx: Esophagitis, Mallory Weiss/Boerhaave, Variceal bleeding, PUD/gastritis/ulcers, colon cancer, rectal bleed, internal/external hemorrhoids: These are considered less likely due to history of present illness, physical exam, labs/imaging findings  Review of prior external notes: 02/26/2021 admission  Unique Tests and My Independent Interpretation:  CBC: Unremarkable BMP: Unremarkable Type and screen: Pending Fecal occult: Positive  Social Determinants of Health: none  Discussion with Independent Historian:  Husband and daughter  Discussion of Management of Tests:  Cunningham, MD GI  Risk: High: hospitalization or escalation of hospital-level care  Risk Stratification Score: None  Plan: On exam patient was no acute distress with stable vitals. Physical exam showed dark red and bright red blood per rectum when we did a rectal exam with a chaperone present.  Patient does not appear pale and has good capillary refill however will get the rest of labs.  Patient does see lower GI and suspect that she may need to have a colonoscopy done and will reach out to him as well and plan on admission.  Patient does have significant history of GI bleed requiring 16 hemostatic stents back in 02/26/2021 after having a colonoscopy where they removed a few polyps and she went to the  hemorrhagic shock.  Patient not currently on any blood thinners.  Patient's hemoglobin is stable however there is a 2 point drop from the previous reading 11 months ago.  I messaged the on-call [facility] to notify them that patient will need a scope in the morning will consult hospitalist for admission.  Spoke to the hospitalist and patient was accepted for admission.  Hospitalist and I believe the patient may also be having a diverticular bleed however will admit for observation.  Patient stable.  This chart was dictated using voice recognition software.  Despite best efforts to proofread,  errors can occur which can change the documentation meaning.        Final Clinical Impression(s) /  ED Diagnoses Final diagnoses:  Lower GI bleed    Rx / DC Orders ED Discharge Orders     None         Remi Deter 06/03/23 0330    Palumbo, April, MD 06/03/23 [phone]

## 2023-06-03 NOTE — Progress Notes (Signed)
 AVS and discharge instructions reviewed w/ patient and spouse at the bedside. Both parties verbalized understanding.

## 2023-06-03 NOTE — ED Triage Notes (Signed)
 Pt arrives w/ c/o bright red blood in stool x 2 hours. Reports it is constantly coming out. Hx GI bleed & had to receive 4 blood transfusions.

## 2023-06-03 NOTE — Discharge Summary (Signed)
 Physician Discharge Summary   Patient: Erin Kaufman MRN: [id number] DOB: 07/31/1970  Admit date:     06/03/2023  Discharge date: 06/03/23  Discharge Physician: Alberteen Sam   PCP: Richardean Chimera, MD     Recommendations at discharge:  Follow-up with PCP in 1 week for repeat BMP and CBC     Discharge Diagnoses: Principal Problem:   Bright red rectal bleeding, likely hemorrhoidal Active Problems:   Hyponatremia   Hypothyroidism   Essential (primary) hypertension   OSA (obstructive sleep apnea)      Hospital Course: 53 year old F with HTN, HLD, hypothyroidism, and previous diverticular bleed who presented with painless hematochezia.  Patient was in her usual state of health until she was having a bowel movement, passed bright red blood in the toilet bowl.  In the ER, hemoglobin 15, sodium 128, admitted for observation.     Rectal bleeding Patient was evaluated by GI.  On rectal exam, she had brown stool.  Endoscopy was performed and inflamed hemorrhoids were visible.  Serial hemoglobins were normal.  Suspicion was for hemorrhoidal bleeding, GI recommended high-fiber diet, hydrocortisone suppositories   Hyponatremia Sodium 128 on admission.  She was given fluids and her sodium improved to 133.  She was asymptomatic.  She is on no medications that would cause hyponatremia.  Given improvement, she is discharged with close PCP follow-up.             The [facility] Controlled Substances Registry was reviewed for this patient prior to discharge.  Consultants: Gastroenterology Procedures performed: Bedside anoscopy Disposition: Home Diet recommendation:  Discharge Diet Orders (From admission, onward)     Start     Ordered   06/03/23 0000  Diet - low sodium heart healthy        06/03/23 1211             DISCHARGE MEDICATION: Allergies as of 06/03/2023       Reactions   Penicillins Swelling, Rash   All over the body   Aspirin    Caused  bleeding        Medication List     TAKE these medications    cetirizine 10 MG tablet Commonly known as: ZYRTEC Take 10 mg by mouth daily as needed for allergies.   cholecalciferol 1000 units tablet Commonly known as: VITAMIN D Take 1,000 Units by mouth daily.   lisinopril 10 MG tablet Commonly known as: ZESTRIL TAKE 1 TABLET BY MOUTH EVERY DAY   NexIUM 40 MG capsule Generic drug: esomeprazole TAKE 1 CAPSULE (40 MG TOTAL) BY MOUTH DAILY.   Synthroid 125 MCG tablet Generic drug: levothyroxine Take 1 tablet (125 mcg total) by mouth daily.        Follow-up Information     Hilarie Fredrickson, MD Follow up.   Specialty: Gastroenterology Why: Dr. Lamar Sprinkles office will contact you regarding your symptoms and need for follow-up. Contact information: [address] [place] [address] [phone]                 Discharge Instructions     Diet - low sodium heart healthy   Complete by: As directed    Discharge instructions   Complete by: As directed    **IMPORTANT DISCHARGE INSTRUCTIONS**   From Dr. Maryfrances Bunnell: You were admitted for rectal bleeding  Here, you were evaluated by Dr. Lamar Sprinkles partner from [facility]  Based on his evaluation, and your stable (normal) hemoglobin level, we feel it is likely that the  bleeding was benign, from hemorrhoids.  Resume all your normal home medicines.  Your sodium level was slightly low.  But this improved in the hospital with taking IV fluids.    Follow up with your primary doctor in 1 week and ask them to check your sodium level   Increase activity slowly   Complete by: As directed        Discharge Exam: Filed Weights   06/03/23 0816  Weight: 71.4 kg    General: Pt is alert, awake, not in acute distress Cardiovascular: RRR, nl S1-S2, no murmurs appreciated.   No LE edema.   Respiratory: Normal respiratory rate and rhythm.  CTAB without rales or wheezes. Abdominal: Abdomen soft and non-tender.  No  distension or HSM.   Neuro/Psych: Strength symmetric in upper and lower extremities.  Judgment and insight appear normal.   Condition at discharge: good  The results of significant diagnostics from this hospitalization (including imaging, microbiology, ancillary and laboratory) are listed below for reference.   Imaging Studies: No results found.  Microbiology: Results for orders placed or performed during the hospital encounter of 02/26/21  Resp Panel by RT-PCR (Flu A&B, Covid) Nasopharyngeal Swab     Status: None   Collection Time: 02/26/21  7:28 AM   Specimen: Nasopharyngeal Swab; Nasopharyngeal(NP) swabs in vial transport medium  Result Value Ref Range Status   SARS Coronavirus 2 by RT PCR NEGATIVE NEGATIVE Final    Comment: (NOTE) SARS-CoV-2 target nucleic acids are NOT DETECTED.  The SARS-CoV-2 RNA is generally detectable in upper respiratory specimens during the acute phase of infection. The lowest concentration of SARS-CoV-2 viral copies this assay can detect is 138 copies/mL. A negative result does not preclude SARS-Cov-2 infection and should not be used as the sole basis for treatment or other patient management decisions. A negative result may occur with  improper specimen collection/handling, submission of specimen other than nasopharyngeal swab, presence of viral mutation(s) within the areas targeted by this assay, and inadequate number of viral copies(<138 copies/mL). A negative result must be combined with clinical observations, patient history, and epidemiological information. The expected result is Negative.  Fact Sheet for Patients:  BloggerCourse.com  Fact Sheet for Healthcare Providers:  SeriousBroker.it  This test is no t yet approved or cleared by the [place] FDA and  has been authorized for detection and/or diagnosis of SARS-CoV-2 by FDA under an Emergency Use Authorization (EUA). This EUA will  remain  in effect (meaning this test can be used) for the duration of the COVID-19 declaration under Section 564(b)(1) of the Act, 21 U.S.C.section 360bbb-3(b)(1), unless the authorization is terminated  or revoked sooner.       Influenza A by PCR NEGATIVE NEGATIVE Final   Influenza B by PCR NEGATIVE NEGATIVE Final    Comment: (NOTE) The Xpert Xpress SARS-CoV-2/FLU/RSV plus assay is intended as an aid in the diagnosis of influenza from Nasopharyngeal swab specimens and should not be used as a sole basis for treatment. Nasal washings and aspirates are unacceptable for Xpert Xpress SARS-CoV-2/FLU/RSV testing.  Fact Sheet for Patients: BloggerCourse.com  Fact Sheet for Healthcare Providers: SeriousBroker.it  This test is not yet approved or cleared by the [place] FDA and has been authorized for detection and/or diagnosis of SARS-CoV-2 by FDA under an Emergency Use Authorization (EUA). This EUA will remain in effect (meaning this test can be used) for the duration of the COVID-19 declaration under Section 564(b)(1) of the Act, 21 U.S.C. section 360bbb-3(b)(1), unless the authorization is  terminated or revoked.  Performed at [facility], [address], [place], [place] [address]   MRSA Next Gen by PCR, Nasal     Status: None   Collection Time: 02/26/21  8:21 PM   Specimen: Nasal Mucosa; Nasal Swab  Result Value Ref Range Status   MRSA by PCR Next Gen NOT DETECTED NOT DETECTED Final    Comment: (NOTE) The GeneXpert MRSA Assay (FDA approved for NASAL specimens only), is one component of a comprehensive MRSA colonization surveillance program. It is not intended to diagnose MRSA infection nor to guide or monitor treatment for MRSA infections. Test performance is not FDA approved in patients less than 72 years old. Performed at [facility], [address], [place], [place] [address]     Labs: CBC: Recent  Labs  Lab 06/03/23 0211 06/03/23 1003  WBC 10.0 5.8  HGB 15.7* 14.5  HCT 45.6 42.0  MCV 98.9 99.3  PLT 261 242   Basic Metabolic Panel: Recent Labs  Lab 06/03/23 0211 06/03/23 1003  NA 128* 133*  K 3.5 4.3  CL 95* 101  CO2 20* 23  GLUCOSE 105* 87  BUN 10 7  CREATININE 0.79 0.63  CALCIUM 8.6* 8.4*   Liver Function Tests: Recent Labs  Lab 06/03/23 0211  AST 15  ALT 14  ALKPHOS 59  BILITOT 0.5  PROT 7.2  ALBUMIN 3.8   CBG: No results for input(s): "GLUCAP" in the last 168 hours.  Discharge time spent: approximately 25 minutes spent on discharge counseling, evaluation of patient on day of discharge, and coordination of discharge planning with nursing, social work, pharmacy and case management  Signed: Alberteen Sam, MD [facility] 06/03/2023

## 2023-06-03 NOTE — H&P (Signed)
 History and Physical    Erin Kaufman YQM:[id number] DOB: 06-28-1970 DOA: 06/03/2023  PCP: Richardean Chimera, MD   Patient coming from: Home   Chief Complaint: Rectal bleeding   HPI: Erin Kaufman is a 53 y.o. female with medical history significant for hypertension, hyperlipidemia, hypothyroidism, and diverticulosis who presents with painless hematochezia.   Patient reports that she was in her usual state last night when she sat on the toilet, but passed bright red blood.  With this initial episode, she reports passing stool, bright red blood.  She then had another episode that involved bright red blood only.  She has not experienced any recent abdominal pain or nausea.  She was admitted with post polypectomy bleed and hemorrhagic shock in December 2023.  She denies any other episodes of GI bleeding.  ED Course: Upon arrival to the ED, patient is found to be afebrile and saturating well on room air with normal heart rate and stable blood pressure.  Labs are most notable for sodium 128, BUN 10, hemoglobin 15.7, and positive FOBT.   GI (Dr. Tomasa Rand) was consulted by the ED PA.  Review of Systems:  All other systems reviewed and apart from HPI, are negative.  Past Medical History:  Diagnosis Date   Anxiety    Blood transfusion without reported diagnosis    GERD (gastroesophageal reflux disease)    on meds   Hiatal hernia    Hyperlipidemia    on meds   Hypertension    on meds   Thyroid disease    on meds    Past Surgical History:  Procedure Laterality Date   C SECTIONS     X3   COLONOSCOPY WITH PROPOFOL N/A 02/26/2021   Procedure: COLONOSCOPY WITH PROPOFOL;  Surgeon: Benancio Deeds, MD;  Location: [facility] ENDOSCOPY;  Service: Gastroenterology;  Laterality: N/A;   FINGER SURGERY Left 2019   3rd and 4th metatarsal fracture   HEMOSTASIS CLIP PLACEMENT  02/26/2021   Procedure: HEMOSTASIS CLIP PLACEMENT;  Surgeon: Benancio Deeds, MD;  Location: [facility] ENDOSCOPY;   Service: Gastroenterology;;   UPPER GASTROINTESTINAL ENDOSCOPY  2010   Dr. Marshell Garfinkel   VAGINAL HYSTERECTOMY  2007   WISDOM TOOTH EXTRACTION  1988    Social History:   reports that she has been smoking cigarettes. She has a 25 pack-year smoking history. She has never been exposed to tobacco smoke. She has never used smokeless tobacco. She reports current alcohol use of about 4.0 - 6.0 standard drinks of alcohol per week. She reports that she does not use drugs.  Allergies  Allergen Reactions   Penicillins Swelling and Rash    All over the body   Aspirin     Caused bleeding    Family History  Problem Relation Age of Onset   Brain cancer Mother        tumor - did not specify type   Lymphoma Mother    Diabetes Mother    Hypertension Mother    Heart disease Mother    Colon polyps Father    Prostate cancer Father    Colon cancer Neg Hx    Esophageal cancer Neg Hx    Rectal cancer Neg Hx    Stomach cancer Neg Hx    Crohn's disease Neg Hx    Ulcerative colitis Neg Hx      Prior to Admission medications   Medication Sig Start Date End Date Taking? Authorizing Provider  cetirizine (ZYRTEC) 10 MG tablet Take 10 mg by mouth  daily as needed for allergies. 03/20/23  Yes [provider]  cholecalciferol (VITAMIN D) 1000 units tablet Take 1,000 Units by mouth daily.   Yes [provider]  lisinopril (ZESTRIL) 10 MG tablet TAKE 1 TABLET BY MOUTH EVERY DAY 07/19/22  Yes Pricilla Riffle, MD  NEXIUM 40 MG capsule TAKE 1 CAPSULE (40 MG TOTAL) BY MOUTH DAILY. 03/29/13  Yes Hilarie Fredrickson, MD  SYNTHROID 125 MCG tablet Take 1 tablet (125 mcg total) by mouth daily. 11/27/12  Yes Darnell Level, MD    Physical Exam: Vitals:   06/03/23 0145 06/03/23 0146  BP:  106/67  Pulse: 86 81  Resp:  19  Temp:  97.9 F (36.6 C)  TempSrc:  Oral  SpO2: 97% 99%    Constitutional: NAD, no pallor   Eyes: PERTLA, lids and conjunctivae normal ENMT: Mucous membranes are moist. Posterior pharynx  clear of any exudate or lesions.   Neck: supple, no masses  Respiratory: no wheezing, no crackles. No accessory muscle use.  Cardiovascular: S1 & S2 heard, regular rate and rhythm. No extremity edema.  Abdomen: No distension, no tenderness, soft. Bowel sounds active.  Musculoskeletal: no clubbing / cyanosis. No joint deformity upper and lower extremities.   Skin: no significant rashes, lesions, ulcers. Warm, dry, well-perfused. Neurologic: CN 2-12 grossly intact. Moving all extremities. Alert and oriented.  Psychiatric: Calm. Cooperative.    Labs and Imaging on Admission: I have personally reviewed following labs and imaging studies  CBC: Recent Labs  Lab 06/03/23 0211  WBC 10.0  HGB 15.7*  HCT 45.6  MCV 98.9  PLT 261   Basic Metabolic Panel: Recent Labs  Lab 06/03/23 0211  NA 128*  K 3.5  CL 95*  CO2 20*  GLUCOSE 105*  BUN 10  CREATININE 0.79  CALCIUM 8.6*   GFR: Estimated Creatinine Clearance: 72.8 mL/min (by C-G formula based on SCr of 0.79 mg/dL). Liver Function Tests: Recent Labs  Lab 06/03/23 0211  AST 15  ALT 14  ALKPHOS 59  BILITOT 0.5  PROT 7.2  ALBUMIN 3.8   No results for input(s): "LIPASE", "AMYLASE" in the last 168 hours. No results for input(s): "AMMONIA" in the last 168 hours. Coagulation Profile: No results for input(s): "INR", "PROTIME" in the last 168 hours. Cardiac Enzymes: No results for input(s): "CKTOTAL", "CKMB", "CKMBINDEX", "TROPONINI" in the last 168 hours. BNP (last 3 results) No results for input(s): "PROBNP" in the last 8760 hours. HbA1C: No results for input(s): "HGBA1C" in the last 72 hours. CBG: No results for input(s): "GLUCAP" in the last 168 hours. Lipid Profile: No results for input(s): "CHOL", "HDL", "LDLCALC", "TRIG", "CHOLHDL", "LDLDIRECT" in the last 72 hours. Thyroid Function Tests: No results for input(s): "TSH", "T4TOTAL", "FREET4", "T3FREE", "THYROIDAB" in the last 72 hours. Anemia Panel: No results for  input(s): "VITAMINB12", "FOLATE", "FERRITIN", "TIBC", "IRON", "RETICCTPCT" in the last 72 hours. Urine analysis: No results found for: "COLORURINE", "APPEARANCEUR", "LABSPEC", "PHURINE", "GLUCOSEU", "HGBUR", "BILIRUBINUR", "KETONESUR", "PROTEINUR", "UROBILINOGEN", "NITRITE", "LEUKOCYTESUR" Sepsis Labs: @LABRCNTIP (procalcitonin:4,lacticidven:4) )No results found for this or any previous visit (from the past 240 hours).   Radiological Exams on Admission: No results found.  EKG: Independently reviewed. Sinus rhythm.   Assessment/Plan   1. Acute GI bleeding  - Painless hematochezia, no upper GI sxs, initial Hgb is 15.7, and she is hemodynamically stable  - Continue bowel rest, trend H&H, follow-up on GI recommendations    2. Hyponatremia  - Serum sodium 128 on admission  - She appears euvolemic  -  Check urine sodium and urine osmolality, hydrate with isotonic IVF while NPO, trend sodium    3. Hypertension  - Treat as-needed for now    4. Hypothyroidism  - Synthroid    DVT prophylaxis: SCDs  Code Status: Full  Level of Care: Level of care: Telemetry Family Communication: Husband at bedside  Disposition Plan:  Patient is from: home  Anticipated d/c is to: Home  Anticipated d/c date is: 06/05/23  Patient currently: Pending stable H&H  Consults called: GI consulted by ED PA  Admission status: Observation     Briscoe Deutscher, MD [facility]  06/03/2023, 5:11 AM

## 2023-06-03 NOTE — Consult Note (Signed)
 Consultation  Referring Provider:     [facility] Primary Care Physician:  Richardean Chimera, MD Primary Gastroenterologist:       Marina Goodell  Reason for Consultation:     hematochezia     Impression / Plan:   Rectal bleeding from bleeding internal hemorrhoids.  ------------------------------------------------------------------------------------------------------------------ Recommend discharge to home.  Hydrocortisone suppositories twice daily x 5 days and then as needed.  Higher fiber diet and/or fiber supplement e.g. Metamucil 1 tablespoon daily.  [facility] will contact her to arrange follow-up if needed  Iva Boop, MD, [facility] Gastroenterology See Loretha Stapler on call - gastroenterology for best contact person 06/03/2023 12:09 PM         HPI:   Erin Kaufman is a 53 y.o. female w/ hx colon polyps, diverticulosis, GERD, hypertension and hypothyroidism admitted with painless hematochezia.  Rectal exam in ED revealed mix of dark and bright red blood. No clots or melena.  The patient and her husband report that she had a normal bowel movement yesterday morning but then in the evening or at night she went up and passed a large amount of blood, they described bright red, into the toilet and then she leaked a little bit onto the bed.  They then presented to the emergency room where they had another episode.  She urinated and wiped recently and there was a small spot of bright red blood on the paper.  Her bowel habits have been regular.  There is no abdominal pain.  She did do a little bit of lifting yesterday cleaning her daughter's room.  But no recent coughing or straining or alteration in bowel habits otherwise.    Colonoscopy 02/17/22 Dr. Marina Goodell - Two 3 to 4 mm polyps in the sigmoid colon and in the descending colon, removed with a cold snare. Resected and retrieved ADENOMAS. - Diverticulosis in the left colon and in the right colon. SIGMOID STENOSIS - The examination was otherwise  normal on direct and retroflexion views. Internal hemorrhoids.   Colonoscopy 02/26/21 Dr. Adela Lank - A significant amount of fresh blood and clots in the entire examined colon. - Postpolypectomy ulcers as described above. Treated with hemostasis clips. Suspect one of the right sided polypectomy sites was more than likely responsible for her bleeding as above. - Diverticulosis in the left colon. - The examination was otherwise normal  Colonoscopy 02/25/2021 Dr. Marina Goodell - Multiple (13) 2 to 12 mm polyps in the rectum and in the cecum, removed with hot and cold snare techniques. Resected and retrieved. -1 hpp and rest ADENOMAS Diverticulosis in the left colon and in the right colon. - Internal hemorrhoids. - The examination was otherwise normal on direct and retroflexion views    Past Medical History:  Diagnosis Date   Anxiety    Blood transfusion without reported diagnosis    GERD (gastroesophageal reflux disease)    on meds   Hiatal hernia    Hyperlipidemia    on meds   Hypertension    on meds   Thyroid disease    on meds    Past Surgical History:  Procedure Laterality Date   C SECTIONS     X3   COLONOSCOPY WITH PROPOFOL N/A 02/26/2021   Procedure: COLONOSCOPY WITH PROPOFOL;  Surgeon: Benancio Deeds, MD;  Location: [facility] ENDOSCOPY;  Service: Gastroenterology;  Laterality: N/A;   FINGER SURGERY Left 2019   3rd and 4th metatarsal fracture   HEMOSTASIS CLIP PLACEMENT  02/26/2021   Procedure: HEMOSTASIS CLIP  PLACEMENT;  Surgeon: Benancio Deeds, MD;  Location: [facility] ENDOSCOPY;  Service: Gastroenterology;;   UPPER GASTROINTESTINAL ENDOSCOPY  2010   Dr. Marshell Garfinkel   VAGINAL HYSTERECTOMY  2007   WISDOM TOOTH EXTRACTION  1988    Family History  Problem Relation Age of Onset   Brain cancer Mother        tumor - did not specify type   Lymphoma Mother    Diabetes Mother    Hypertension Mother    Heart disease Mother    Colon polyps Father    Prostate cancer Father    Colon  cancer Neg Hx    Esophageal cancer Neg Hx    Rectal cancer Neg Hx    Stomach cancer Neg Hx    Crohn's disease Neg Hx    Ulcerative colitis Neg Hx     Social History   Tobacco Use   Smoking status: Every Day    Current packs/day: 1.00    Average packs/day: 1 pack/day for 25.0 years (25.0 ttl pk-yrs)    Types: Cigarettes    Passive exposure: Never   Smokeless tobacco: Never   Tobacco comments:    form given 03-20-12  Vaping Use   Vaping status: Never Used  Substance Use Topics   Alcohol use: Yes    Alcohol/week: 4.0 - 6.0 standard drinks of alcohol    Types: 4 - 6 Standard drinks or equivalent per week    Comment: socially   Drug use: Never    Prior to Admission medications   Medication Sig Start Date End Date Taking? Authorizing Provider  cetirizine (ZYRTEC) 10 MG tablet Take 10 mg by mouth daily as needed for allergies. 03/20/23  Yes [provider]  cholecalciferol (VITAMIN D) 1000 units tablet Take 1,000 Units by mouth daily.   Yes [provider]  lisinopril (ZESTRIL) 10 MG tablet TAKE 1 TABLET BY MOUTH EVERY DAY 07/19/22  Yes Pricilla Riffle, MD  NEXIUM 40 MG capsule TAKE 1 CAPSULE (40 MG TOTAL) BY MOUTH DAILY. 03/29/13  Yes Hilarie Fredrickson, MD  SYNTHROID 125 MCG tablet Take 1 tablet (125 mcg total) by mouth daily. 11/27/12  Yes Darnell Level, MD    Current Facility-Administered Medications  Medication Dose Route Frequency Provider Last Rate Last Admin   0.9 %  sodium chloride infusion   Intravenous Continuous Opyd, Lavone Neri, MD 75 mL/hr at 06/03/23 0345 New Bag at 06/03/23 0345   acetaminophen (TYLENOL) tablet 650 mg  650 mg Oral Q6H PRN Opyd, Lavone Neri, MD       Or   acetaminophen (TYLENOL) suppository 650 mg  650 mg Rectal Q6H PRN Opyd, Lavone Neri, MD       levothyroxine (SYNTHROID) tablet 125 mcg  125 mcg Oral Q0600 Briscoe Deutscher, MD   125 mcg at 06/03/23 0635   ondansetron (ZOFRAN) injection 4 mg  4 mg Intravenous Q6H PRN Opyd, Lavone Neri, MD        pantoprazole (PROTONIX) EC tablet 40 mg  40 mg Oral Daily Opyd, Lavone Neri, MD   40 mg at 06/03/23 1049   sodium chloride flush (NS) 0.9 % injection 3 mL  3 mL Intravenous Q12H Briscoe Deutscher, MD   3 mL at 06/03/23 0341    Allergies as of 06/03/2023 - Review Complete 06/03/2023  Allergen Reaction Noted   Penicillins Swelling and Rash    Aspirin  06/22/2022     Review of Systems:    This is positive for those  things mentioned in the HPI. All other review of systems are negative.       Physical Exam:  Vital signs in last 24 hours: Temp:  [97.9 F (36.6 C)-98.8 F (37.1 C)] 98.4 F (36.9 C) (03/23 0816) Pulse Rate:  [81-90] 87 (03/23 0816) Resp:  [17-22] 17 (03/23 0816) BP: (92-106)/(53-69) 98/69 (03/23 0816) SpO2:  [92 %-99 %] 92 % (03/23 0816) Weight:  [71.4 kg] 71.4 kg (03/23 0816) Last BM Date : 06/02/23  General:  Well-developed, well-nourished and in no acute distress Lungs: Clear to auscultation bilaterally. Heart:   S1S2, no rubs, murmurs, gallops. Abdomen:  soft, mildly protuberant, obese, non-tender, no hepatosplenomegaly, hernia, or mass and BS+.  Rectal:  Female nursing staff present.  There is a slight protrusion of the right posterior hemorrhoid column.  Digital exam is mildly uncomfortable, no mass.  Palpable hemorrhoids in the canal.  Formed brown stool no blood on exam finger.  Anoscopy is performed and demonstrates inflamed internal hemorrhoids, the right posterior complex is a grade 2 with stigmata of recent bleeding.   Neuro:  A&O x 3.  Psych:  appropriate mood and  Affect.   Data Reviewed:   LAB RESULTS: Recent Labs    06/03/23 0211 06/03/23 1003  WBC 10.0 5.8  HGB 15.7* 14.5  HCT 45.6 42.0  PLT 261 242   BMET Recent Labs    06/03/23 0211 06/03/23 1003  NA 128* 133*  K 3.5 4.3  CL 95* 101  CO2 20* 23  GLUCOSE 105* 87  BUN 10 7  CREATININE 0.79 0.63  CALCIUM 8.6* 8.4*   LFT Recent Labs    06/03/23 0211  PROT 7.2  ALBUMIN  3.8  AST 15  ALT 14  ALKPHOS 59  BILITOT 0.5

## 2023-06-04 ENCOUNTER — Telehealth: Payer: Self-pay

## 2023-06-04 NOTE — Telephone Encounter (Signed)
 Spoke with pt and rx was not sent in however pt states she is not having any bleeding at this time. Instructed pt to call back if she does and prescription can be sent in at that time or she could come in for a visit.  Pt verbalized understanding.

## 2023-06-04 NOTE — Telephone Encounter (Signed)
-----   Message from Stan Head sent at 06/03/2023 12:12 PM EDT ----- Regarding: Follow-up Erin Kaufman,  Patient is seen in the hospital with rectal bleeding.  I did an anoscopy and it looks like hemorrhoidal bleeding.  I think if she responds to the hydrocortisone suppositories follow-up is not necessary.  Please contact her and see how she is doing and you guys can decide how to proceed regarding follow-up.  Hemoglobin was normal the entire time.  CEG

## 2023-07-01 NOTE — Progress Notes (Signed)
 Cardiology Office Note   Date:  07/02/2023   ID:  Erin Kaufman, DOB 1970-05-06, MRN [id number]  PCP:  Merryl Abraham, MD  Cardiologist:   Ola Berger, MD    Patient presents for follow-up of hypertension, CAD   History of Present Illness: Erin Kaufman is a 53 y.o.  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   cholecalciferol (VITAMIN D ) 1000 units tablet Take 1,000 Units by mouth daily.   lisinopril  (ZESTRIL ) 10 MG tablet TAKE 1 TABLET BY MOUTH EVERY DAY   NEXIUM  40 MG capsule TAKE 1 CAPSULE (40 MG TOTAL) BY MOUTH DAILY.   SYNTHROID  125 MCG tablet Take 1 tablet (125 mcg total) by mouth daily.     Allergies:   Penicillins and Aspirin    Past Medical History:  Diagnosis Date   Anxiety    Blood transfusion without reported diagnosis    GERD (gastroesophageal reflux disease)    on meds   Hiatal hernia    Hyperlipidemia    on meds   Hypertension    on meds   Thyroid  disease    on meds    Past Surgical History:  Procedure Laterality Date   C SECTIONS     X3   COLONOSCOPY WITH PROPOFOL  N/A 02/26/2021   Procedure: COLONOSCOPY WITH PROPOFOL ;  Surgeon: Ace Holder, MD;  Location: [facility] ENDOSCOPY;  Service: Gastroenterology;  Laterality: N/A;   FINGER SURGERY Left 2019   3rd and 4th metatarsal fracture    HEMOSTASIS CLIP PLACEMENT  02/26/2021   Procedure: HEMOSTASIS CLIP PLACEMENT;  Surgeon: Ace Holder, MD;  Location: [facility] ENDOSCOPY;  Service: Gastroenterology;;   UPPER GASTROINTESTINAL ENDOSCOPY  2010   Dr. Jasper Messenger   VAGINAL HYSTERECTOMY  2007   WISDOM TOOTH EXTRACTION  1988     Social History:  The patient  reports that she has been smoking cigarettes. She has a 25 pack-year smoking history. She has never been exposed to tobacco smoke. She has never used smokeless tobacco. She reports current alcohol use of about 4.0 - 6.0 standard drinks of alcohol per week. She reports that she does not use drugs.   Family History:  The patient's family history includes Brain cancer in her mother; Colon polyps in her father; Diabetes in her mother; Heart disease in her mother; Hypertension in her mother; Lymphoma in her mother; Prostate cancer in her father.    ROS:  Please see the history of present illness. All other systems are reviewed and  Negative to the above problem except as noted.    PHYSICAL EXAM: VS:  BP 114/76   Pulse (!) 110   Ht  5' (1.524 m)   Wt 154 lb 9.6 oz (70.1 kg)   SpO2 97%   BMI 30.19 kg/m   GEN: Obese 53 year old in no acute distress  Neck: no JVD, carotid bruits, Cardiac: RRR; Gr I-II/VI sysolic murmur LSB  No LE edema   Respiratory:  clear to auscultation  GI: soft, nontender, nondistended  No hepatomegaly    EKG:  EKG is not ordered today    Lipid Panel    Component Value Date/Time   CHOL 180 03/18/2019 0936   TRIG 66 03/18/2019 0936   HDL 78 03/18/2019 0936   CHOLHDL 2.3 03/18/2019 0936   CHOLHDL 6 08/22/2013 1047   VLDL 24.2 08/22/2013 1047   LDLCALC 90 03/18/2019 0936      Wt Readings from Last 3 Encounters:  07/02/23 154 lb 9.6 oz (70.1 kg)  06/03/23 157 lb 6.5 oz (71.4 kg)  05/31/23 155 lb (70.3 kg)      ASSESSMENT AND PLAN:  1.  CAD.  Patient with a calcium  score of 9 on CT scan. Risk factor modify  2  Hx aortic dilitation    Asc aorta was 42 in 2019   CT for cancer screen did not show aortic dilitation.   Noncontrast but would follow for now    3 HL  Add Crestor  10 mg   Follow up lipomed in 8 wks  4  Murmur   PT is anxious about meeting    Murmur may be from increased flow   Would set up for an echo to confirm valve function  4.  Hypertension   BP remains  well controlled   5  GI   REcent LGI bleed  6  OSA  Seen by Golden Late   Using CPAP   Current medicines are reviewed at length with the patient today.  The patient does not have concerns regarding medicines.  Signed, Ola Berger, MD  07/02/2023 9:42 AM    [facility] [address] [place], [place], [place]  [address] Phone: [phone]; Fax: [phone]

## 2023-07-02 ENCOUNTER — Encounter: Payer: Self-pay | Admitting: Internal Medicine

## 2023-07-02 ENCOUNTER — Ambulatory Visit: Payer: Self-pay | Attending: Internal Medicine | Admitting: Internal Medicine

## 2023-07-02 VITALS — BP 114/76 | HR 110 | Ht 60.0 in | Wt 154.6 lb

## 2023-07-02 DIAGNOSIS — I779 Disorder of arteries and arterioles, unspecified: Secondary | ICD-10-CM

## 2023-07-02 DIAGNOSIS — I10 Essential (primary) hypertension: Secondary | ICD-10-CM

## 2023-07-02 DIAGNOSIS — I712 Thoracic aortic aneurysm, without rupture, unspecified: Secondary | ICD-10-CM | POA: Diagnosis not present

## 2023-07-02 NOTE — Patient Instructions (Signed)
 Medication Instructions:  START CRESTOR  10 MG A DAY  *If you need a refill on your cardiac medications before your next appointment, please call your pharmacy*  Lab Work: [employer] AT YOUR CONVENIENCE AT ANY [facility]   If you have labs (blood work) drawn today and your tests are completely normal, you will receive your results only by: MyChart Message (if you have MyChart) OR A paper copy in the mail If you have any lab test that is abnormal or we need to change your treatment, we will call you to review the results.  Testing/Procedures: Your physician has requested that you have an echocardiogram. Echocardiography is a painless test that uses sound waves to create images of your heart. It provides your doctor with information about the size and shape of your heart and how well your heart's chambers and valves are working. This procedure takes approximately one hour. There are no restrictions for this procedure. Please do NOT wear cologne, perfume, aftershave, or lotions (deodorant is allowed). Please arrive 15 minutes prior to your appointment time.  Please note: We ask at that you not bring children with you during ultrasound (echo/ vascular) testing. Due to room size and safety concerns, children are not allowed in the ultrasound rooms during exams. Our front office staff cannot provide observation of children in our lobby area while testing is being conducted. An adult accompanying a patient to their appointment will only be allowed in the ultrasound room at the discretion of the ultrasound technician under special circumstances. We apologize for any inconvenience.   Follow-Up: At [facility], you and your health needs are our priority.  As part of our continuing mission to provide you with exceptional heart care, our providers are all part of one team.  This team includes your primary Cardiologist (physician) and Advanced Practice Providers or APPs (Physician Assistants and Nurse  Practitioners) who all work together to provide you with the care you need, when you need it.  Your next appointment:  ONE YEAR  We recommend signing up for the patient portal called "MyChart".  Sign up information is provided on this After Visit Summary.  MyChart is used to connect with patients for Virtual Visits (Telemedicine).  Patients are able to view lab/test results, encounter notes, upcoming appointments, etc.  Non-urgent messages can be sent to your provider as well.   To learn more about what you can do with MyChart, go to [email or web address].   Other Instructions       1st Floor: - Lobby - Registration  - Pharmacy  - Lab - Cafe  2nd Floor: - PV Lab - Diagnostic Testing (echo, CT, nuclear med)  3rd Floor: - Vacant  4th Floor: - TCTS (cardiothoracic surgery) - AFib Clinic - Structural Heart Clinic - Vascular Surgery  - Vascular Ultrasound  5th Floor: - [facility]Care Cardiology (general and EP) - Clinical Pharmacy for coumadin, hypertension, lipid, weight-loss medications, and med management appointments    Valet parking services will be available as well.

## 2023-07-03 MED ORDER — ROSUVASTATIN CALCIUM 10 MG PO TABS: 10.0000 mg | ORAL_TABLET | Freq: Every day | ORAL | 3 refills | Status: AC

## 2023-07-03 NOTE — Addendum Note (Signed)
 Addended by: Alanna Alley on: 07/03/2023 12:46 PM   Modules accepted: Orders

## 2023-07-04 ENCOUNTER — Other Ambulatory Visit: Payer: Self-pay | Admitting: Internal Medicine

## 2023-08-07 ENCOUNTER — Ambulatory Visit (HOSPITAL_COMMUNITY)
Admission: RE | Admit: 2023-08-07 | Discharge: 2023-08-07 | Disposition: A | Discharge status: Home / Self Care | Source: Ambulatory Visit | Attending: Cardiology | Admitting: Cardiology

## 2023-08-07 DIAGNOSIS — I712 Thoracic aortic aneurysm, without rupture, unspecified: Secondary | ICD-10-CM | POA: Diagnosis present

## 2023-08-07 DIAGNOSIS — I10 Essential (primary) hypertension: Secondary | ICD-10-CM

## 2023-08-07 DIAGNOSIS — I779 Disorder of arteries and arterioles, unspecified: Secondary | ICD-10-CM | POA: Diagnosis present

## 2023-08-07 LAB — ECHOCARDIOGRAM COMPLETE: S' Lateral: 2.4 cm

## 2023-08-09 ENCOUNTER — Other Ambulatory Visit: Payer: Self-pay

## 2023-08-09 ENCOUNTER — Ambulatory Visit: Payer: Self-pay | Admitting: Internal Medicine

## 2023-08-09 DIAGNOSIS — I10 Essential (primary) hypertension: Secondary | ICD-10-CM

## 2023-08-09 DIAGNOSIS — Z79899 Other long term (current) drug therapy: Secondary | ICD-10-CM

## 2023-10-18 ENCOUNTER — Other Ambulatory Visit: Payer: Self-pay | Admitting: Obstetrics and Gynecology

## 2023-10-18 DIAGNOSIS — Z720 Tobacco use: Secondary | ICD-10-CM

## 2023-10-30 ENCOUNTER — Encounter: Payer: Self-pay | Admitting: Obstetrics and Gynecology

## 2023-11-05 ENCOUNTER — Ambulatory Visit
Admission: RE | Admit: 2023-11-05 | Discharge: 2023-11-05 | Disposition: A | Discharge status: Home / Self Care | Source: Ambulatory Visit | Attending: Obstetrics and Gynecology | Admitting: Obstetrics and Gynecology

## 2023-11-05 DIAGNOSIS — Z720 Tobacco use: Secondary | ICD-10-CM

## 2024-01-22 ENCOUNTER — Other Ambulatory Visit: Payer: Self-pay | Admitting: Surgery

## 2024-01-22 DIAGNOSIS — E039 Hypothyroidism, unspecified: Secondary | ICD-10-CM
